# Patient Record
Sex: Male | Born: 1935 | Race: White | Hispanic: No | Marital: Married | Smoking: Former smoker
Health system: Southern US, Community
[De-identification: ages and names within clinical notes are randomized; demographics above are authoritative.]

## PROBLEM LIST (undated history)

## (undated) DIAGNOSIS — C801 Malignant (primary) neoplasm, unspecified: Secondary | ICD-10-CM

## (undated) DIAGNOSIS — R443 Hallucinations, unspecified: Secondary | ICD-10-CM

## (undated) DIAGNOSIS — E785 Hyperlipidemia, unspecified: Secondary | ICD-10-CM

## (undated) DIAGNOSIS — F0390 Unspecified dementia without behavioral disturbance: Secondary | ICD-10-CM

## (undated) DIAGNOSIS — I219 Acute myocardial infarction, unspecified: Secondary | ICD-10-CM

## (undated) DIAGNOSIS — I10 Essential (primary) hypertension: Secondary | ICD-10-CM

## (undated) HISTORY — PX: OTHER SURGICAL HISTORY: SHX169

## (undated) HISTORY — PX: NEPHRECTOMY: SHX65

## (undated) HISTORY — PX: CHOLECYSTECTOMY: SHX55

## (undated) HISTORY — DX: Hallucinations, unspecified: R44.3

## (undated) HISTORY — DX: Acute myocardial infarction, unspecified: I21.9

## (undated) HISTORY — DX: Essential (primary) hypertension: I10

## (undated) HISTORY — DX: Hyperlipidemia, unspecified: E78.5

---

## 1997-12-22 ENCOUNTER — Ambulatory Visit (HOSPITAL_COMMUNITY): Admission: RE | Admit: 1997-12-22 | Discharge: 1997-12-22 | Payer: Self-pay | Admitting: Gastroenterology

## 1998-11-07 ENCOUNTER — Inpatient Hospital Stay (HOSPITAL_COMMUNITY): Admission: EM | Admit: 1998-11-07 | Discharge: 1998-11-09 | Payer: Self-pay | Admitting: Emergency Medicine

## 1998-11-07 ENCOUNTER — Encounter: Payer: Self-pay | Admitting: *Deleted

## 1999-02-11 ENCOUNTER — Emergency Department (HOSPITAL_COMMUNITY): Admission: EM | Admit: 1999-02-11 | Discharge: 1999-02-11 | Payer: Self-pay | Admitting: Emergency Medicine

## 1999-02-11 ENCOUNTER — Encounter: Payer: Self-pay | Admitting: *Deleted

## 2000-04-27 ENCOUNTER — Emergency Department (HOSPITAL_COMMUNITY): Admission: EM | Admit: 2000-04-27 | Discharge: 2000-04-27 | Payer: Self-pay | Admitting: *Deleted

## 2000-05-31 ENCOUNTER — Other Ambulatory Visit: Admission: RE | Admit: 2000-05-31 | Discharge: 2000-05-31 | Payer: Self-pay | Admitting: Urology

## 2000-05-31 ENCOUNTER — Encounter (INDEPENDENT_AMBULATORY_CARE_PROVIDER_SITE_OTHER): Payer: Self-pay

## 2000-09-13 ENCOUNTER — Other Ambulatory Visit: Admission: RE | Admit: 2000-09-13 | Discharge: 2000-09-13 | Payer: Self-pay | Admitting: Dermatology

## 2000-12-15 ENCOUNTER — Encounter: Payer: Self-pay | Admitting: *Deleted

## 2000-12-16 ENCOUNTER — Inpatient Hospital Stay (HOSPITAL_COMMUNITY): Admission: EM | Admit: 2000-12-16 | Discharge: 2000-12-18 | Payer: Self-pay | Admitting: Emergency Medicine

## 2001-04-12 ENCOUNTER — Inpatient Hospital Stay (HOSPITAL_COMMUNITY): Admission: EM | Admit: 2001-04-12 | Discharge: 2001-04-13 | Payer: Self-pay | Admitting: Emergency Medicine

## 2001-04-12 ENCOUNTER — Encounter: Payer: Self-pay | Admitting: Emergency Medicine

## 2001-05-30 ENCOUNTER — Other Ambulatory Visit: Admission: RE | Admit: 2001-05-30 | Discharge: 2001-05-30 | Payer: Self-pay | Admitting: Dermatology

## 2002-02-17 ENCOUNTER — Other Ambulatory Visit: Admission: RE | Admit: 2002-02-17 | Discharge: 2002-02-17 | Payer: Self-pay | Admitting: Dermatology

## 2002-11-12 ENCOUNTER — Ambulatory Visit (HOSPITAL_COMMUNITY): Admission: RE | Admit: 2002-11-12 | Discharge: 2002-11-12 | Payer: Self-pay | Admitting: Orthopedic Surgery

## 2002-12-22 ENCOUNTER — Inpatient Hospital Stay (HOSPITAL_COMMUNITY): Admission: RE | Admit: 2002-12-22 | Discharge: 2002-12-25 | Payer: Self-pay | Admitting: Urology

## 2002-12-22 ENCOUNTER — Encounter (INDEPENDENT_AMBULATORY_CARE_PROVIDER_SITE_OTHER): Payer: Self-pay

## 2004-01-26 ENCOUNTER — Ambulatory Visit: Payer: Self-pay | Admitting: Family Medicine

## 2004-03-15 ENCOUNTER — Ambulatory Visit: Payer: Self-pay | Admitting: Family Medicine

## 2004-04-20 ENCOUNTER — Ambulatory Visit (HOSPITAL_COMMUNITY): Admission: RE | Admit: 2004-04-20 | Discharge: 2004-04-20 | Payer: Self-pay | Admitting: Urology

## 2004-12-21 ENCOUNTER — Ambulatory Visit: Payer: Self-pay | Admitting: Family Medicine

## 2005-11-18 ENCOUNTER — Emergency Department (HOSPITAL_COMMUNITY): Admission: EM | Admit: 2005-11-18 | Discharge: 2005-11-18 | Payer: Self-pay | Admitting: Emergency Medicine

## 2005-11-25 ENCOUNTER — Inpatient Hospital Stay (HOSPITAL_COMMUNITY): Admission: EM | Admit: 2005-11-25 | Discharge: 2005-11-26 | Payer: Self-pay | Admitting: Emergency Medicine

## 2005-12-06 ENCOUNTER — Encounter (INDEPENDENT_AMBULATORY_CARE_PROVIDER_SITE_OTHER): Payer: Self-pay | Admitting: Specialist

## 2005-12-06 ENCOUNTER — Ambulatory Visit (HOSPITAL_BASED_OUTPATIENT_CLINIC_OR_DEPARTMENT_OTHER): Admission: RE | Admit: 2005-12-06 | Discharge: 2005-12-06 | Payer: Self-pay | Admitting: Urology

## 2006-05-12 ENCOUNTER — Inpatient Hospital Stay (HOSPITAL_COMMUNITY): Admission: EM | Admit: 2006-05-12 | Discharge: 2006-05-15 | Payer: Self-pay | Admitting: Emergency Medicine

## 2006-09-11 ENCOUNTER — Ambulatory Visit: Payer: Self-pay | Admitting: Internal Medicine

## 2006-09-11 LAB — CONVERTED CEMR LAB
CO2: 32 meq/L (ref 19–32)
Calcium: 9.8 mg/dL (ref 8.4–10.5)
GFR calc Af Amer: 47 mL/min
Glucose, Bld: 94 mg/dL (ref 70–99)
Potassium: 4.7 meq/L (ref 3.5–5.1)
Sodium: 144 meq/L (ref 135–145)

## 2006-09-12 DIAGNOSIS — K219 Gastro-esophageal reflux disease without esophagitis: Secondary | ICD-10-CM | POA: Insufficient documentation

## 2006-09-12 DIAGNOSIS — D0910 Carcinoma in situ of unspecified urinary organ: Secondary | ICD-10-CM

## 2006-09-12 DIAGNOSIS — N135 Crossing vessel and stricture of ureter without hydronephrosis: Secondary | ICD-10-CM | POA: Insufficient documentation

## 2006-09-12 DIAGNOSIS — Z8546 Personal history of malignant neoplasm of prostate: Secondary | ICD-10-CM

## 2006-09-12 DIAGNOSIS — I2510 Atherosclerotic heart disease of native coronary artery without angina pectoris: Secondary | ICD-10-CM | POA: Insufficient documentation

## 2006-09-27 ENCOUNTER — Ambulatory Visit (HOSPITAL_COMMUNITY): Admission: RE | Admit: 2006-09-27 | Discharge: 2006-09-27 | Payer: Self-pay | Admitting: Urology

## 2007-03-05 ENCOUNTER — Encounter: Payer: Self-pay | Admitting: *Deleted

## 2007-03-05 DIAGNOSIS — Z9861 Coronary angioplasty status: Secondary | ICD-10-CM | POA: Insufficient documentation

## 2007-03-05 DIAGNOSIS — I219 Acute myocardial infarction, unspecified: Secondary | ICD-10-CM | POA: Insufficient documentation

## 2007-03-05 DIAGNOSIS — F411 Generalized anxiety disorder: Secondary | ICD-10-CM | POA: Insufficient documentation

## 2007-03-05 DIAGNOSIS — Z9189 Other specified personal risk factors, not elsewhere classified: Secondary | ICD-10-CM | POA: Insufficient documentation

## 2007-03-05 DIAGNOSIS — H269 Unspecified cataract: Secondary | ICD-10-CM

## 2007-03-05 DIAGNOSIS — Z9889 Other specified postprocedural states: Secondary | ICD-10-CM

## 2007-03-05 DIAGNOSIS — J309 Allergic rhinitis, unspecified: Secondary | ICD-10-CM | POA: Insufficient documentation

## 2007-03-06 ENCOUNTER — Ambulatory Visit: Payer: Self-pay | Admitting: Internal Medicine

## 2007-03-06 DIAGNOSIS — I10 Essential (primary) hypertension: Secondary | ICD-10-CM | POA: Insufficient documentation

## 2007-03-06 DIAGNOSIS — R413 Other amnesia: Secondary | ICD-10-CM

## 2007-03-06 LAB — CONVERTED CEMR LAB
ALT: 16 units/L (ref 0–53)
Albumin: 4.2 g/dL (ref 3.5–5.2)
Bilirubin, Direct: 0.1 mg/dL (ref 0.0–0.3)
Calcium: 9.4 mg/dL (ref 8.4–10.5)
Folate: 10.3 ng/mL
GFR calc Af Amer: 42 mL/min
GFR calc non Af Amer: 35 mL/min
Glucose, Bld: 106 mg/dL — ABNORMAL HIGH (ref 70–99)
Sed Rate: 6 mm/hr (ref 0–20)
Triglycerides: 85 mg/dL (ref 0–149)
VLDL: 17 mg/dL (ref 0–40)
Vitamin B-12: 279 pg/mL (ref 211–911)

## 2007-03-07 ENCOUNTER — Encounter: Payer: Self-pay | Admitting: Internal Medicine

## 2007-09-10 ENCOUNTER — Telehealth: Payer: Self-pay | Admitting: Internal Medicine

## 2007-11-19 ENCOUNTER — Ambulatory Visit: Admission: RE | Admit: 2007-11-19 | Discharge: 2007-12-16 | Payer: Self-pay | Admitting: Radiation Oncology

## 2007-11-20 ENCOUNTER — Ambulatory Visit (HOSPITAL_COMMUNITY): Admission: RE | Admit: 2007-11-20 | Discharge: 2007-11-20 | Payer: Self-pay | Admitting: Urology

## 2007-11-21 ENCOUNTER — Encounter: Payer: Self-pay | Admitting: Internal Medicine

## 2007-12-24 ENCOUNTER — Encounter: Payer: Self-pay | Admitting: Internal Medicine

## 2008-01-22 ENCOUNTER — Ambulatory Visit: Admission: RE | Admit: 2008-01-22 | Discharge: 2008-04-14 | Payer: Self-pay | Admitting: Radiation Oncology

## 2008-01-23 ENCOUNTER — Encounter: Payer: Self-pay | Admitting: Internal Medicine

## 2008-03-16 ENCOUNTER — Telehealth: Payer: Self-pay | Admitting: Internal Medicine

## 2008-04-14 ENCOUNTER — Ambulatory Visit: Admission: RE | Admit: 2008-04-14 | Discharge: 2008-05-12 | Payer: Self-pay | Admitting: Radiation Oncology

## 2008-11-30 ENCOUNTER — Ambulatory Visit: Payer: Self-pay | Admitting: Internal Medicine

## 2008-12-01 ENCOUNTER — Inpatient Hospital Stay (HOSPITAL_COMMUNITY): Admission: EM | Admit: 2008-12-01 | Discharge: 2008-12-02 | Payer: Self-pay | Admitting: Emergency Medicine

## 2008-12-02 ENCOUNTER — Encounter: Payer: Self-pay | Admitting: Internal Medicine

## 2008-12-07 ENCOUNTER — Encounter: Payer: Self-pay | Admitting: Internal Medicine

## 2008-12-11 ENCOUNTER — Encounter: Payer: Self-pay | Admitting: Internal Medicine

## 2008-12-13 ENCOUNTER — Encounter: Admission: RE | Admit: 2008-12-13 | Discharge: 2008-12-13 | Payer: Self-pay | Admitting: Neurology

## 2008-12-29 ENCOUNTER — Inpatient Hospital Stay (HOSPITAL_COMMUNITY): Admission: RE | Admit: 2008-12-29 | Discharge: 2008-12-30 | Payer: Self-pay | Admitting: General Surgery

## 2008-12-29 ENCOUNTER — Encounter: Payer: Self-pay | Admitting: General Surgery

## 2008-12-29 ENCOUNTER — Encounter (INDEPENDENT_AMBULATORY_CARE_PROVIDER_SITE_OTHER): Payer: Self-pay | Admitting: Surgery

## 2009-03-18 ENCOUNTER — Encounter: Payer: Self-pay | Admitting: Internal Medicine

## 2009-07-21 ENCOUNTER — Encounter: Payer: Self-pay | Admitting: Internal Medicine

## 2009-08-23 ENCOUNTER — Encounter: Payer: Self-pay | Admitting: Internal Medicine

## 2010-01-29 ENCOUNTER — Encounter: Payer: Self-pay | Admitting: Orthopedic Surgery

## 2010-02-08 NOTE — Consult Note (Signed)
Summary: [facility]  [facility]   Imported By: Lester Pender 08/27/2009 10:16:02  _____________________________________________________________________  External Attachment:    Type:   Image     Comment:   External Document

## 2010-02-08 NOTE — Letter (Signed)
Summary: [facility]  [facility]   Imported By: Sherian Rein 03/29/2009 09:58:30  _____________________________________________________________________  External Attachment:    Type:   Image     Comment:   External Document

## 2010-04-11 LAB — BASIC METABOLIC PANEL
BUN: 13 mg/dL (ref 6–23)
BUN: 15 mg/dL (ref 6–23)
BUN: 17 mg/dL (ref 6–23)
Calcium: 9.2 mg/dL (ref 8.4–10.5)
Chloride: 103 mEq/L (ref 96–112)
Creatinine, Ser: 1.49 mg/dL (ref 0.4–1.5)
GFR calc non Af Amer: 46 mL/min — ABNORMAL LOW (ref >60)
GFR calc non Af Amer: 53 mL/min — ABNORMAL LOW (ref >60)
Glucose, Bld: 130 mg/dL — ABNORMAL HIGH (ref 70–99)
Glucose, Bld: 138 mg/dL — ABNORMAL HIGH (ref 70–99)
Glucose, Bld: 141 mg/dL — ABNORMAL HIGH (ref 70–99)
Potassium: 4.1 mEq/L (ref 3.5–5.1)
Potassium: 5.3 mEq/L — ABNORMAL HIGH (ref 3.5–5.1)
Sodium: 139 mEq/L (ref 135–145)

## 2010-04-11 LAB — CBC
HCT: 37.2 % — ABNORMAL LOW (ref 39.0–52.0)
HCT: 38.9 % — ABNORMAL LOW (ref 39.0–52.0)
MCHC: 33 g/dL (ref 30.0–36.0)
MCHC: 34 g/dL (ref 30.0–36.0)
MCV: 89 fL (ref 78.0–100.0)
MCV: 89.1 fL (ref 78.0–100.0)
Platelets: 250 10*3/uL (ref 150–400)
Platelets: 215 10*3/uL (ref 150–400)
Platelets: 218 10*3/uL (ref 150–400)
Platelets: 221 10*3/uL (ref 150–400)
RDW: 13.6 % (ref 11.5–15.5)
RDW: 13.9 % (ref 11.5–15.5)
RDW: 14.1 % (ref 11.5–15.5)
WBC: 8.2 10*3/uL (ref 4.0–10.5)

## 2010-04-11 LAB — COMPREHENSIVE METABOLIC PANEL
ALT: 20 U/L (ref 0–53)
AST: 20 U/L (ref 0–37)
Albumin: 3.9 g/dL (ref 3.5–5.2)
Calcium: 9.6 mg/dL (ref 8.4–10.5)
GFR calc Af Amer: 53 mL/min — ABNORMAL LOW (ref >60)
Sodium: 141 mEq/L (ref 135–145)
Total Protein: 6.3 g/dL (ref 6.0–8.3)

## 2010-04-11 LAB — URINALYSIS, MICROSCOPIC ONLY
Ketones, ur: NEGATIVE mg/dL
Leukocytes, UA: NEGATIVE
Nitrite: NEGATIVE
Protein, ur: NEGATIVE mg/dL
pH: 5 (ref 5.0–8.0)

## 2010-04-11 LAB — LIPASE, BLOOD
Lipase: 46 U/L (ref 11–59)
Lipase: 23 U/L (ref 11–59)

## 2010-04-13 LAB — POCT I-STAT, CHEM 8
HCT: 42 % (ref 39.0–52.0)
Hemoglobin: 14.3 g/dL (ref 13.0–17.0)
Potassium: 4.7 mEq/L (ref 3.5–5.1)
Sodium: 141 mEq/L (ref 135–145)

## 2010-04-13 LAB — COMPREHENSIVE METABOLIC PANEL
BUN: 21 mg/dL (ref 6–23)
Calcium: 9.3 mg/dL (ref 8.4–10.5)
Glucose, Bld: 183 mg/dL — ABNORMAL HIGH (ref 70–99)
Sodium: 140 mEq/L (ref 135–145)
Total Protein: 6.3 g/dL (ref 6.0–8.3)

## 2010-04-13 LAB — LIPID PANEL
Cholesterol: 171 mg/dL (ref 0–200)
HDL: 64 mg/dL (ref >39)
Total CHOL/HDL Ratio: 2.7 RATIO
Triglycerides: 42 mg/dL (ref <150)

## 2010-04-13 LAB — BASIC METABOLIC PANEL
BUN: 22 mg/dL (ref 6–23)
CO2: 25 mEq/L (ref 19–32)
Calcium: 9 mg/dL (ref 8.4–10.5)
Creatinine, Ser: 1.33 mg/dL (ref 0.4–1.5)
GFR calc non Af Amer: 52 mL/min — ABNORMAL LOW (ref >60)
Glucose, Bld: 128 mg/dL — ABNORMAL HIGH (ref 70–99)
Sodium: 138 mEq/L (ref 135–145)

## 2010-04-13 LAB — URINALYSIS, ROUTINE W REFLEX MICROSCOPIC
Glucose, UA: NEGATIVE mg/dL
Hgb urine dipstick: NEGATIVE
Ketones, ur: NEGATIVE mg/dL
Protein, ur: NEGATIVE mg/dL
pH: 5.5 (ref 5.0–8.0)

## 2010-04-13 LAB — CBC
HCT: 39.2 % (ref 39.0–52.0)
Hemoglobin: 13.3 g/dL (ref 13.0–17.0)
MCHC: 34 g/dL (ref 30.0–36.0)
RDW: 14 % (ref 11.5–15.5)

## 2010-04-13 LAB — DIFFERENTIAL
Lymphocytes Relative: 3 % — ABNORMAL LOW (ref 12–46)
Lymphs Abs: 0.2 10*3/uL — ABNORMAL LOW (ref 0.7–4.0)
Monocytes Relative: 1 % — ABNORMAL LOW (ref 3–12)
Neutro Abs: 8.1 10*3/uL — ABNORMAL HIGH (ref 1.7–7.7)
Neutrophils Relative %: 96 % — ABNORMAL HIGH (ref 43–77)

## 2010-04-13 LAB — POCT CARDIAC MARKERS
CKMB, poc: 2.8 ng/mL (ref 1.0–8.0)
Troponin i, poc: <0.05 ng/mL (ref 0.00–0.09)
Troponin i, poc: <0.05 ng/mL (ref 0.00–0.09)

## 2010-04-13 LAB — PROTIME-INR
INR: 0.98 (ref 0.00–1.49)
Prothrombin Time: 12.9 seconds (ref 11.6–15.2)

## 2010-04-13 LAB — URINE CULTURE: Colony Count: NO GROWTH

## 2010-05-24 NOTE — Assessment & Plan Note (Signed)
[facility]                           PRIMARY CARE OFFICE NOTE   [name], Cameron                      MRN:          [id number]  DATE:09/11/2006                            DOB:          09/09/1928    Cameron [name] is a 77 year old Caucasian gentleman whom I have not seen  since a [facility] visit in August of 2005.  Previously had seen the  patient last in 1997.   The patient presents with a complaint of a 56-month history of  dysequilibrium.  He also reports he has been having some problems with  sinus congestion, sneezing and loss of smell.  He does describe a  fullness in his ears.  He does have a history of allergic rhinitis.   The patient has been followed at [facility] for prostate cancer in the past as  well as being seen locally by Dr. Retta Diones.  By the patient's report,  he is supposedly is to be scheduled for a bone scan as part of his  ongoing followup.   The patient is scheduled for cataract surgery on the left eye later in  the month of September.   PAST MEDICAL HISTORY:   MEDICAL:  1. Usual childhood disease.  2. History of prostate cancer.  Treatment details not available to me.  3. Coronary artery disease with an myocardial infarction in 1984.      Last cardiac catheterization May, 5, 2008 which revealed no      significant obstructive disease, but an aneurysmal left anterior      descending.  4. Renal cell carcinoma.  5. History of ureteral structure.  6. History of severe gastroesophageal reflux disease with respiratory      problems.  7. Allergy with rhinitis.  8. Bilateral cataracts.   SURGICAL HISTORY:  1. Prostate surgery, remote.  2. Laparoscopic nephrectomy on the left, December of 2004, for renal      cell carcinoma.  3. TUR bladder neck for contracture, December 06, 2005.   PHYSICIAN ROSTER:  1. Dr. Jacinto Halim was the patient's last operative cardiologist.  2. Dr. Jenne Campus is his routine cardiologist.  3. Dr. Sherene Sires  for pulmonary.  4. Dr. Retta Diones for urology.   CURRENT MEDICATIONS:  1. Lorazepam 0.5 mg p.r.n.  2. Flomax 0.4 mg daily.  3. Metoprolol ER 25 mg 1/2 tablet daily.  4. Prilosec OTC 20 mg q.a.m.  5. Vytorin 10 one daily.  6. Aspirin 81 mg daily.   FAMILY HISTORY:  Noncontributory.   SOCIAL HISTORY:  The patient lost his wife after a long marriage in  August of 2007.  She succumbed to end-stage chronic obstructive  pulmonary disease.  The patient currently lives alone, but he has 3  daughters who lives nearby, and they do bring him meals on a regular  basis.   REVIEW OF SYSTEMS:  The patient has had no fevers, sweats or chills.  His weight has been stable.  He has seen Dr. Dagoberto Ligas for  ophthalmology, and is scheduled for cataract surgery.  No ENT,  cardiovascular, respiratory, or GI complaints are noted.   PHYSICAL  EXAMINATION:  Temperature was 97.5, blood pressure 120/80,  pulse was 62, weight 173.  GENERAL APPEARANCE:  He is a well-nourished, well-developed gentleman,  looks younger than his stated chronologic age, in no acute distress.  HEENT EXAM:  EACs and TMs did appear normal.  The patient had no  tenderness to percussion over his frontal or maxillary sinuses.  CHEST:  The patient's lungs were clear with no rales, wheezes, or  rhonchi.  CARDIOVASCULAR:  2+ radial pulse.  He had a regular rate and rhythm  without murmurs, rubs, or gallops.  NEURO EXAM:  The patient had decreased hearing in his left ear by  Rinne's test, and also with lateralization of the left on Weber test.   ASSESSMENT AND PLAN:  1. Dysequilibrium, suspect the patient has labyrinthitis.  He is      advised to take meclizine 12.5 to 25 mg q.6 h. p.r.n.  He is      advised to take pseudoephedrine 30 mg t.i.d.  2. The patient's other medical problems are as listed above.  I would      like him to return to see me for a more complete physical exam and      to reestablish for ongoing continuity  care.     Rosalyn Gess Norins, MD  Electronically Signed    MEN/MedQ  DD: 09/12/2006  DT: 09/12/2006  Job #: [id number]   cc:   Cameron [name]

## 2010-05-24 NOTE — Discharge Summary (Signed)
Cameron [name], Cameron [name] NO.:  [id number]   MEDICAL RECORD NO.:  [id number]           PATIENT TYPE:   LOCATION:                                 FACILITY:   PHYSICIAN:  Vonna Kotyk R. Jacinto Halim, MD       DATE OF BIRTH:  1928/08/04   DATE OF ADMISSION:  05/12/2006  DATE OF DISCHARGE:  05/15/2006                               DISCHARGE SUMMARY   DISCHARGE DIAGNOSES:  1. Chest pain, worrisome for unstable angina, catheterization this      admission showing minor coronary disease with good left ventricular      function.  2. Known moderate coronary disease with a 60% to 70% left anterior      descending in the past in 2002.  3. Treated dyslipidemia.  4. History of renal cell cancer, status post nephrectomy   HOSPITAL COURSE:  The patient is a 77 year old male who was admitted by  Dr. Jacinto Halim May 13, 2006 with chest pain worrisome for unstable angina.  He  had retrosternal pressure-like pain, which lasted about 15 minutes.  After admission to the emergency room, he was pain-free.  He does have a  history of moderate coronary disease in 2002 by catheterization.  He was  admitted to telemetry with unstable angina.  A cath done May 14, 2006,  showed no significant disease, good LV function.  He did have some  ectatic arteries and aneurysmal dilatation of the LAD.  Plavix was  added.  We felt he could be discharged May 15, 2006.  We did cut his beta  blocker back at discharge as he was bradycardic.   DISCHARGE MEDICATIONS:  Niaspan 500 mg p.o. h.s., aspirin 81 mg a day,  Toprol XL 25 mg 1/2 tablet a day, Prilosec OTC once a day, Plavix 75 mg  a day, Xanax 0.25 q.6h. p.r.n., Vytorin 10/20 daily, and ophthalmic  drops as taken at home.   EKG shows sinus rhythm, sinus bradycardia without acute changes.  Chest  x-ray shows no acute cardiopulmonary disease.  White count 7.3,  hemoglobin 12.9, hematocrit 38.6, platelets 176, INR 1.1.  Sodium 137,  potassium 4.2, BUN 13, creatinine 1.49.   Liver functions were normal.  CK was 247 with negative MB and troponin, LDL 53, HDL 39.  TSH 2.32.   DISPOSITION:  The patient is discharged in stable condition and will  follow up with Dr. Jenne Campus as an outpatient.      Abelino Derrick, P.A.      Cristy Hilts. Jacinto Halim, MD  Electronically Signed    LKK/MEDQ  D:  02/21/2007  T:  02/23/2007  Job:  [id number]

## 2010-05-27 NOTE — Cardiovascular Report (Signed)
NAMEAMES, HOBAN               ACCOUNT NO.:  [id number]   MEDICAL RECORD NO.:  [id number]          PATIENT TYPE:  INP   LOCATION:  4715                         FACILITY:  [facility]   PHYSICIAN:  Cristy Hilts. Jacinto Halim, MD       DATE OF BIRTH:  [birth year]   DATE OF PROCEDURE:  05/14/2006  DATE OF DISCHARGE:                            CARDIAC CATHETERIZATION   ATTENDING CARDIOLOGIST:  Darlin Priestly, M.D.   REFERRING PHYSICIAN:  Rosalyn Gess. Norins, M.D.   PROCEDURES PERFORMED:  1. Left ventriculography.  2. Selective right and left coronary arteriography.  3. Ascending aortogram.  4. Abdominal aortogram.   INDICATIONS:  Mr. Cameron [name] is a 77 year old fairly active gentleman  with a history of proximal 60-70% stenosis of the LAD by prior cardiac  catheterization in 2002.  He was admitted to the hospital with recurrent  chest pain which was equivalent unstable angina.  She was ruled out for  myocardial infarction.  EKG did not reveal any significant change.  Because of known coronary artery disease, he was now brought to the  cardiac catheterization lab to reevaluate his coronary anatomy.  Please  note that in 2002 he had a flow wire evaluation of this LAD stenosis  which was not felt to be hemodynamically significant at that time.   HEMODYNAMIC DATA:  The left ventricular pressures were 131/4 with end-  diastolic pressure of 20 mmHg.  The aortic pressure 131/60 with a mean  of 90 mmHg.  There was no pressure gradient across the aortic valve.   ANGIOGRAPHIC DATA:  LEFT VENTRICLE:  Left ventricular systolic function  was normal with ejection fraction of 55-60%.  There was no significant  mitral regurgitation.   RIGHT CORONARY ARTERY:  Right coronary artery is a large-caliber vessel  and a dominant vessel.  It is smooth and normal.   LEFT MAIN:  Left main is a large-caliber vessel.  Distally, it has mild  ectasia at the origin of circumflex and LAD.   LEFT ANTERIOR DESCENDING:  LAD  is a large-caliber vessel.  It has an  aneurysmal dilatation in the proximal LAD followed by normal segment in  the proximal to mid segments.  There is a large diagonal that comes off  from the proximal segment (high diagonal one) which has an ostial 30-40%  stenosis, but is otherwise smooth and normal.  The rest of the LAD has  very minimal luminal irregularity.   RAMUS INTERMEDIATE:  Ramus intermediate is a small to moderate-caliber  vessel which is smooth and normal.   CIRCUMFLEX:  Circumflex is a large-caliber vessel.  It has mild ectasia  in the proximal segment and nicely tapers off in the mid segment.  There  is no significant luminal obstruction in the circumflex.   IMPRESSIONS:  1. Aneurysmal dilatation of the proximal left anterior descending      (LAD) which appears to be slightly progressed compared to the prior      cardiac catheterization in 2002 with a 40% diagonal ostial      stenosis.  The rest of the LAD has very minimal  luminal      irregularity.  2. Very minimal ectasia of the proximal circumflex with smooth      tapering into the body of the circumflex without any significant      luminal obstruction.  3. Right coronary artery is normal.  4. No evidence of dissection, thrombus into the left main or left      anterior descending artery.   RECOMMENDATIONS:  1. The patient will be continued on risk modification.  Plavix will be      added to his present regimen with aspirin.  2. Will also add Niaspan 500 mg p.o. at night and see if we can      titrate it up to 1000 mg p.o. at night to improve his HDL profile.  3. Abdominal aortogram revealed one renal artery on the right which is      widely patent.  There was no evidence abdominal aortic aneurysm.  4. The ascending aortogram revealed no evidence ascending aneurysm.      The abdominal aortogram and ascending aortogram were performed to      evaluate for aneurysmal dilatation given the aneurysmal dilatation       of the LAD.  5. Single renal artery which is widely patent.  Left renal artery has      known left nephrectomy in the past.  The patient will be continued      with hydration and probably be discharged home in the morning.   COMPLICATIONS:  There was a very small dissection noted at the insertion  of the sheath into the right femoral artery which did not appear to be  hemodynamically significant on pullback.  Manual pressure was held.  He  will follow up with Dr. Jenne Campus in 2-3 weeks.   A total of 120 mL of contrast was utilized for diagnostic angiography.   TECHNIQUE OF THE PROCEDURE:  Under usual sterile precautions, using a 6-  French right femoral arterial access, a multipurpose B2 catheter was  advanced into the ascending aorta and then into the left ventricle.  Left ventriculography was performed both in LAO and RAO projection.  Catheter was then pulled back into the ascending aorta and right  coronary artery selectively engaged and angiography was performed.  Because of a large aortic root, a 6-French Judkins Left-5 diagnostic  catheter was utilized to engage left main coronary artery and  angiography was performed.  Then, this catheter was pulled out in the  usual fashion over the J-wire and the multipurpose B2 catheter was re-  advanced back into the ascending artery and the ascending aortogram was  performed in the LAO projection and abdominal aortogram in the AP  projection.  Right femoral angiography was performed through the  arterial access sheath.  The patient tolerated the procedure.  Except  for some minor sheath hematoma, no significant complications were noted.      Cristy Hilts. Jacinto Halim, MD  Electronically Signed     JRG/MEDQ  D:  05/14/2006  T:  05/14/2006  Job:  [id number]   cc:   Rosalyn Gess. Norins, MD  Darlin Priestly, MD

## 2010-05-27 NOTE — Discharge Summary (Signed)
Cameron [name], Cameron [name]               ACCOUNT NO.:  [id number]   MEDICAL RECORD NO.:  [id number]          PATIENT TYPE:  INP   LOCATION:  4715                         FACILITY:  [facility]   PHYSICIAN:  Cameron [name], P.A.   DATE OF BIRTH:  1928-01-20   DATE OF ADMISSION:  05/12/2006  DATE OF DISCHARGE:  05/15/2006                               DISCHARGE SUMMARY   ADDENDUM   DISCHARGE MEDICATIONS:  [name] is also taking eye drops at home,  Xanax p.r.n. and Vytorin 10/20 and he will continue those.      Cameron [name], P.ALenard Lance  D:  05/15/2006  T:  05/15/2006  Job:  [id number]

## 2010-05-27 NOTE — Discharge Summary (Signed)
[name], Cameron               ACCOUNT NO.:  [id number]   MEDICAL RECORD NO.:  [id number]          PATIENT TYPE:  INP   LOCATION:  4715                         FACILITY:  [facility]   PHYSICIAN:  Abelino Derrick, P.A.   DATE OF BIRTH:  1928-08-11   DATE OF ADMISSION:  05/12/2006  DATE OF DISCHARGE:  05/15/2006                               DISCHARGE SUMMARY   NO DICTATION FOR THIS JOB.      Abelino Derrick, P.ALenard Lance  D:  05/15/2006  T:  05/15/2006  Job:  [id number]

## 2010-05-27 NOTE — H&P (Signed)
Cameron [name], MCKIBBEN                         ACCOUNT NO.:  [id number]   MEDICAL RECORD NO.:  [id number]                   PATIENT TYPE:  INP   LOCATION:  0012                                 FACILITY:  [facility]   PHYSICIAN:  Susanne Borders, MD                     DATE OF BIRTH:  1928/03/09   DATE OF ADMISSION:  12/22/2002  DATE OF DISCHARGE:                                HISTORY & PHYSICAL   CHIEF COMPLAINT:  Left kidney mass.   HISTORY OF PRESENT ILLNESS:  Cameron [name] is a 77 year old white male who has  been a patient of Dr. Valda Lamb in the past.  He has had a transurethral  resection of prostate gland in the remote past.  The patient recently  underwent a chest x-ray as  part of a preoperative workup for a rotator cuff  injury.  On that chest x-ray, he had multiple nodules within his lung which  were all too small to characterize.  Because of the multiple nodules,  however, a CT scan of the chest was obtained which demonstrated a mass in  the patient's left upper pole of his kidney.  The formal CT scan with  contrast was obtained of the abdomen and pelvis which demonstrated a 4.3 cm  x 4.7 cm solid enhancing mass.  This mass was consistent with a renal cell  carcinoma.  Dr. Aldean Ast presented the patient with his options which  included an open radical nephrectomy as well as a laparoscopic nephrectomy.  The patient wished to undergo a laparoscopic nephrectomy, and was therefore  referred to Dr. Retta Diones.  The patient is to have a left laparoscopic  nephrectomy today, will be admitted postoperatively for his convalescence.   PAST MEDICAL HISTORY:  1. Status post myocardial infarction 20 years ago.  2. Hypercholesterolemia.  3. History of prostate cancer 18 years ago.   MEDICATIONS:  1. Toprol 25 mg p.o. q.a.m.  2. Flomax 0.4 mg p.o. q. day.  3. Prilosec 20 mg p.o. q. day.  4. Aspirin 325 mg p.o. q. day.  5. Mavik 15 mg p.o. q.h.s.   ALLERGIES:  PENICILLIN which causes a  rash.   FAMILY HISTORY:  No known genitourinary disease.  He had a father who died  of cardiac failure and pulmonary embolus.   SOCIAL HISTORY:  The patient is a retired Visual merchandiser.  He has a heavy smoking  history, but quit 37 years ago and is not a drinker.   REVIEW OF SYSTEMS:  See history of present illness, otherwise negative for  all systems.   PHYSICAL EXAMINATION:  GENERAL:  Alert and oriented x3, in no acute  distress.  VITAL SIGNS:  Temperature 97.8, pulse 69, respirations 16, blood pressure  140/86.  HEENT:  Head is normocephalic, atraumatic.  Extraocular movements are  intact.  Oropharynx is clear without erythema or exudate.  NECK:  Supple without masses or  adenopathy.  There is no supraclavicular,  axillary, or inguinal adenopathy.  LUNGS:  Clear to auscultation bilaterally.  HEART:  Regular rate and rhythm without murmurs, rubs, or gallops.  ABDOMEN:  Soft, nontender, nondistended, there are no palpable masses, there  is no costovertebral angle tenderness.  EXTREMITIES:  Without clubbing or edema.  NEUROLOGIC:  No focal motor sensory deficits are appreciated.   IMPRESSION:  A 77 year old with a solid enhancing left renal mass.  The  patient will undergo left laparoscopic nephrectomy today and be admitted  postoperatively for his convalescence.  He will be admitted to the step-down  unit because of his history of cardiac event and other medical morbidities.                                               Susanne Borders, MD    DR/MEDQ  D:  12/22/2002  T:  12/22/2002  Job:  [id number]

## 2010-05-27 NOTE — Discharge Summary (Signed)
NAMEMAVERIK, Cameron [name]                         ACCOUNT NO.:  [id number]   MEDICAL RECORD NO.:  [id number]                   PATIENT TYPE:  INP   LOCATION:  0162                                 FACILITY:  [facility]   PHYSICIAN:  Bertram Millard. Dahlstedt, M.D.          DATE OF BIRTH:  06/28/1928   DATE OF ADMISSION:  12/22/2002  DATE OF DISCHARGE:  12/25/2002                                 DISCHARGE SUMMARY   PRIMARY DIAGNOSIS:  Left renal cell carcinoma.   OTHER DIAGNOSES:  1. Coronary artery disease status post myocardial infarction 20 years ago.  2. Hypercholesterolemia.  3. History of prostate cancer, stable.   BRIEF HISTORY:  A 77 year old male who was admitted at this time for  laparoscopic removal of his left kidney.  His urologic history dates back 20  years when he underwent a TURP and was found to have incidentally discovered  prostate cancer.  That has been followed carefully since that time.  There  is no evidence of recurrence.  He was found to have some small nodules on  his lung on preoperative x-ray for a rotator cuff repair.  Further  evaluation also revealed a left renal mass.  The nodules will be followed by  Dr. Edwyna Shell down the road.  These are quite small and did not need treatment  at the present time.  He is admitted for removal of his left kidney as it  has a 4.7 cm solid enhancing mass.  He has been cleared from cardiac  standpoint.   HOSPITAL COURSE:  The patient was admitted to the operating room and  underwent a left hand-assisted laparoscopic nephrectomy.  He tolerated the  procedure well.  His postoperative course was unremarkable.  He remained  afebrile and was advanced to a regular diet without difficulty.  He was  found to have wounds that were healing well without evidence of infection.  After his catheter was removed he voided well.   He was discharged on postoperative day #3.  At that time he was in improved  condition, on a regular diet, and was on his  regular medications including  oral pain medicines.   Pathology of the specimen revealed left renal cell carcinoma, Furman nuclear  grade 2/4 with pathologic stage T1.   The patient was discharged on his pre hospital medications plus Vicodin and  Colace for a stool softener.  He will follow up with me in approximately one  week.  He was given activity restrictions.                                               Bertram Millard. Dahlstedt, M.D.    SMD/MEDQ  D:  01/01/2003  T:  01/01/2003  Job:  [id number]   cc:   Darlin Priestly, M.D.  [phone]  [address]  [place]  [place] [address]  Fax: [phone]   Rosalyn Gess. Norins, M.D. [facility]

## 2010-05-27 NOTE — Discharge Summary (Signed)
[place]  Patient:    DECARI, DUGGAR Visit Number: [id number] MRN: [id number]          Service Type: MED Location: [phone] Attending Physician:  Silvestre Mesi Dictated by:   Halford Decamp Delanna Ahmadi, R.N., N.P. Admit Date:  12/15/2000 Discharge Date: 12/18/2000   CC:         Rosalyn Gess. Norins, M.D. [facility]   Discharge Summary  HISTORY OF PRESENT ILLNESS:  The patient is a 77 year old white married male patient of Dr. Lenise Herald.  He has a prior medical history of coronary artery disease with a percutaneous transluminal coronary angioplasty approximately two years previous to this admission.  The patient had a myocardial infarction at age 72.  He came into the hospital with chest pain symptoms.  HOSPITAL COURSE:  The patient was put on intravenous heparin and Plavix.  His chest pain was relieved with sublingual nitroglycerin and it was decided the patient needed to undergo catheterization.  LABORATORY STUDIES:  His CK-MBs and troponin levels came back negative.  He underwent catheterization on 12/17/00 by Dr. Lenise Herald. He only had 50 to 60% LAD.  His LV was normal with ejection fraction of 50%. His cholesterol came back with a total cholesterol of 167, triglycerides of 76, HDL 47 and LDL 105.  The patients Mevacor apparently had recently been decreased.  On 12/18/00 his right groin was without bruise or hematoma.  He was seen by Dr. Nanetta Batty and was stable to be discharged home.  DISCHARGE LABORATORY DATA:  His hemoglobin on the day of discharge was 14, hematocrit was 41. His BUN was 9, creatinine 1.1.  His sodium was 141. The potassium was 4.0.  DISCHARGE PHYSICAL EXAMINATION:  VITAL SIGNS: Blood pressure 104/78, heart rate around 58.  DISCHARGE MEDICATIONS: 1. Toprol XL 25 mg once per day. 2. Prilosec 20 mg twice per day.  We increased this for one month and then    he will go back to once a day dosing. 3. Enteric  coated aspirin 325 mg once per day. 4. Mevacor 40 mg.  We will increase this back to 1.5 tablets q.d.  INSTRUCTIONS: 1. Patient is to do no strenuous activity, no lifting, driving and no sexual    activity for three days. 2. Patient should be on a low saturated fat diet. 3. Patient will have follow up with Dr. Jenne Campus on 01/01/01 at 9:30 in the    [place] office.  DISCHARGE DIAGNOSES: 1. Chest pain nonischemic related. 2. History of coronary artery disease. 3. Status post catheterization with 56% left anterior descending. 4. Dyslipidemia. 5. Hypertension, controlled. 6. Sinus bradycardia. 7. History of gastroesophageal reflux disease. Dictated by:   Halford Decamp Delanna Ahmadi, R.N., N.P. Attending Physician:  Silvestre Mesi DD:  12/18/00 TD:  12/18/00 Job: [id number]

## 2010-05-27 NOTE — Discharge Summary (Signed)
[place]  Patient:    Cameron [name], Cameron [name] Visit Number: [id number] MRN: [id number]          Service Type: MED Location: [phone] Attending Physician:  Darlin Priestly Dictated by:   Darcella Gasman Ingold, F.N.P.C. Admit Date:  04/12/2001 Discharge Date: 04/13/2001   CC:         Juluis Mire, M.D.   Discharge Summary  DISCHARGE DIAGNOSES: 1. Chest pain, negative myocardial infarction, nonspecific. 2. History of coronary disease with last catheterization noncritical    disease. 3. Hyperlipidemia.  DISCHARGE MEDICATIONS: 1. Aspirin 325 daily. 2. Toprol XL 50 mg 1/2 daily. 3. Mevacor 40 mg 1.5 pills daily. 4. Prilosec 20 mg daily. 5. Nitroglycerin p.r.n.  DISCHARGE INSTRUCTIONS: ACTIVITY:  As tolerated without exertion.  DIET:  Low-fat, low-cholesterol diet.  FOLLOW-UP:  Office will contact patient with appointment on day of outpatient stress test.  HISTORY OF PRESENT ILLNESS:  [age]-year-old white married male with history of coronary disease including an angioplasty to the LAD in 2000, with his last heart cath December 2002, which revealed no significant disease, patent angioplasty site and EF of 50%.  He did have 50-60% disease in the LAD proximal portion.  That pain was felt to be noncardiac in December when he had the heart cath.  On April 12, 2001 after mowing grass all day, around 6 PM in the evening, he had soreness in his chest.  He took a pill _____ nitroglycerin without relief.  No associated symptoms of nausea, vomiting, diaphoresis or shortness of breath; came to the emergency room.   When he was seen he had no chest pain.  PAST MEDICAL HISTORY:  Cardiac as described.  Hyperlipidemia, hypertension and prostate cancer.  SOCIAL HISTORY, FAMILY HISTORY AND REVIEW OF SYSTEMS:  See H&P.  ALLERGIES:  PENICILLIN.  OUTPATIENT MEDICATIONS: 1. Aspirin daily. 2. Prilosec 20 daily. 3. Toprol XL 25 daily. 4. Mevacor 60  mg daily.  PHYSICAL EXAMINATION AT DISCHARGE:  VITAL SIGNS:  Blood pressure 108/68, pulse 60, respirations 20, temp 96.9 and oxygen saturation 94%.  GENERAL APPEARANCE:  Alert and oriented white male.  SKIN:  Warm and dry.  LUNGS:  Clear without rales, rhonchi or wheezes.  HEART:  Sounds S1 and S2.  Regular rate and rhythm without murmur, gallop, rub or click.  ABDOMEN:  Soft and nontender.  Positive bowel sounds.  EXTREMITIES:  No edema of his extremities.  NEUROLOGIC:  Nonfocal.  LABORATORY DATA:  Hemoglobin 13.6, hematocrit 39.7, WBC 8.1, MCV 84, platelets 224,000, neutrophils 77, lymphs 12, monos 7, eos 4, and basos 0.  Sodium 140, potassium 4.0, chloride 105, glucose 132, BUN 24, and creatinine 1.3.  CK 194, MB 3.4, to 147, MB 2.5.  Troponin I 0.01.  Cholesterol 163, triglycerides 175, HDL 49, and LDL 69.  EKG; sinus rhythm to sinus bradycardia, heart rate 55, inferior infarction age undetermined.  First EKG sinus rhythm; normal EKG.  HOSPITAL COURSE:  Mr. Wainwright came in April 12, 2001, was admitted for over night observation for chest pain.  Cardiac enzymes were negative.  EKG was without acute changes.  IV nitrate was Dcd the morning of April 13, 2001. Patient ambulated without problems and was discharged home.  He will follow up with outpatient Cardiolite.  Discharged by Dr. Jacinto Halim. Dictated by:   Darcella Gasman Ingold, F.N.P.C. Attending Physician:  Darlin Priestly DD:  05/03/01 TD:  05/04/01 Job: [id number]

## 2010-05-27 NOTE — H&P (Signed)
Cameron [name], Cameron [name]                         ACCOUNT NO.:  [id number]   MEDICAL RECORD NO.:  [id number]                   PATIENT TYPE:  INP   LOCATION:  0012                                 FACILITY:  [facility]   PHYSICIAN:  Bertram Millard. Dahlstedt, M.D.          DATE OF BIRTH:  1928/10/17   DATE OF ADMISSION:  12/22/2002  DATE OF DISCHARGE:                                HISTORY & PHYSICAL   REASON FOR ADMISSION:  Left renal mass.   BRIEF HISTORY:  This 77 year old gentleman is admitted to the hospital at  this time for surgical management of a left upper pole mass.  The patient  had a chest x-ray and CT done prior to a scheduled shoulder operation.  The  chest CT revealed a left upper pole mass.  Further characterization was  performed using an abdominal CT which revealed a 4.3 x 5.7 cm area in his  left upper pole suspicious for a renal cell carcinoma.  The patient has some  small lung nodules that was evaluated, and will be followed by Dr. Dewayne Shorter.  At this point, they will just be followed with CT scan and eventual  VATS procedure if there is enlargement of his lung nodules.   Dr. Aldean Ast is the urologist who has been following Mr. Greeno for many  years.  He has stage A1 adenocarcinoma of the prostate without evidence of  progression over more than 10 years of following him.   MEDICAL HISTORY:  Significant for coronary artery disease.  He has been  cleared by Dr. Jenne Campus.  He had an MI 20 years ago.  The patient also has  hypercholesterolemia.   MEDICATIONS:  1. Toprol-XL 50 mg one-half p.o. q.a.m.  2. Vicodin p.r.n. shoulder pain.  3. Flomax 0.4 mg p.o. daily.  4. Prilosec 20 mg daily.  5. An aspirin a day, which was stopped a few weeks ago.  6. Mobic at night.   SOCIAL HISTORY:  The patient quit cigarette smoking 35 years ago.  He is  widowed - his wife had emphysema.  He has three daughters.  He is retired.   REVIEW OF SYSTEMS:  Essentially noncontributory.  He  has no active chest  pain at the current time.  He has no PND or orthopnea.  He has no peripheral  edema.  He voids fairly well, and has had no gross hematuria.  He has no  flank pain.   PHYSICAL EXAMINATION:  GENERAL:  A thin, healthy-appearing elderly male.  VITAL SIGNS:  Normal.  HEENT:  Normal.  NECK:  Supple without thyromegaly or adenopathy.  CHEST:  Clear breath sounds throughout both lung fields.  HEART:  Regular rate and rhythm.  No rubs, gallops, or murmurs.  ABDOMEN:  Scaphoid, soft, nondistended, nontender.  No masses, no megaly.  Bowel sounds present.  EXTERNAL GENITALIA:  Normal.  RECTAL:  Per Dr. Aldean Ast.  PERIPHERAL EXAM:  No edema.  No cyanosis or clubbing.  Distal pulses were  normal.   LABORATORY DATA:  CBC on admission was normal.  BMET and a recent CMET were  all normal, with a normal alkaline phosphatase.  EKG revealed evidence of an  old inferior MI with sinus bradycardia.  Otherwise unremarkable.   IMPRESSION:  1. Left renal mass.  2. Coronary artery disease, stable.  3. History of prostate cancer, stable.   PLAN:  Laparoscopic hand-assisted left nephrectomy.                                               Bertram Millard. Dahlstedt, M.D.    SMD/MEDQ  D:  12/22/2002  T:  12/22/2002  Job:  [id number]   cc:   Darlin Priestly, M.D.  [phone] [address]  [place]  [place] [address]  Fax: [phone]   Rozanna Boer., M.D.  [address]  [place]  [place] [address]  Fax: [phone]   Rosalyn Gess. Norins, M.D. [facility]

## 2010-05-27 NOTE — H&P (Signed)
Cameron [name], Cameron [name]               ACCOUNT NO.:  [id number]   MEDICAL RECORD NO.:  [id number]          PATIENT TYPE:  INP   LOCATION:  1404                         FACILITY:  [facility]   PHYSICIAN:  Bertram Millard. Dahlstedt, M.D.DATE OF BIRTH:  Feb 15, 1928   DATE OF ADMISSION:  11/25/2005  DATE OF DISCHARGE:                              HISTORY & PHYSICAL   REASON FOR ADMISSION:  Fever, chills, burning with urination.   BRIEF HISTORY:  A 77 year old male who is a patient of our practice.  His daughter called earlier today with him having fever 50 101.5,  dysuria, frequency, urgency.  He also has back pain.  The back pain has  been chronic.   He is status post a left nephrectomy 3 years ago for renal cell  carcinoma.  He has had negative followup since that time by Dr.  Aldean Ast.  He had a TURP years ago.  He had incidentally discovered  prostate cancer and has been followed with a stable PSA.   The patient presented to the emergency room here at [facility] last  weekend with gross hematuria.  He was subsequently followed by Dr.  Aldean Ast on Monday.  He underwent cystoscopy.  At that time, urinalysis  was basically clear.  He had a very tight urethral stricture which was  impassable with the scope.  Dilation was to be performed later.  The  patient was placed on a 10-day course of Cipro.   For the past 2 days, he has developed some fevers, feeling bad, chills,  shakes, dysuria, frequency and nocturia q. 1 h.  His daughters called  morning with the subsequent to history, and I recommended that he come  to the emergency room for further evaluation.   PAST MEDICAL HISTORY:  Significant for:  1. Coronary artery disease.  He is followed by Dr. Jenne Campus.  He has      had a PTCA.  2. He has left nephrectomy in December 2004.  3. He had TURP in the remote past.  4. He has anxiety.  5. He has hypercholesterolemia.   MEDICATIONS:  1. Metoprolol 25 mg p.o. q.a.m.  2. Xanax 0.5 mg p.o. q.6 h  p.r.n. anxiety.  3. Vitorin10/20 one p.o. q.a.m.  4. Cipro.  5. He stopped baby aspirin a few days ago.   ALLERGIES:  No known drug allergies.   SOCIAL HISTORY:  He is widowed.  He has three children.  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  He denies current  tobacco use.   FAMILY HISTORY:  Is noncontributory.   REVIEW OF SYSTEMS:  Is significant for recent fever and chills.  He has  had intermittent gross hematuria.  He has previously-mentioned voiding  symptoms.  He has no anterior abdominal pain.  No nausea, no vomiting.  Has been able to take food and fluids.  No change in bowel habits.  No  unplanned weight gain or weight loss.   PHYSICAL EXAMINATION:  GENERAL:  A pleasant elderly male.  VITAL SIGNS: He was febrile.  Vital signs as recorded in chart.  NECK:  Supple without thyromegaly or adenopathy.  CHEST: Clear breath sounds bilaterally.  HEART: Normal rate and rhythm.  ABDOMEN: Flat, soft, nondistended, nontender.  Bladder was nonpalpable.  Slight suprapubic tenderness.  No rebound or guarding, no  hepatosplenomegaly, no masses.  GU: Phallus normal.  No lesions, no fibrotic areas or plaques.  Glans  normal, meatus normal location and size.  No blood or discharge.  Scrotal skin, testicles, cords, epididymal structures normal.  RECTAL: Normal anal sphincter tone.  Gland 3+ with significant  asymmetry, right lobe larger than left.  Quite tender.  No rectal  masses.  SKIN: No peripheral edema.  EXTREMITIES: DP and PT pulses were normal in both feet.   RELEVANT STUDIES:  Revealed a white count of 20,000 (It was normal week  ago).  CMET pending.   Urinalysis is pending.   Chest x-ray pending.   I tried placing a 14-French coude tip catheter.  This would not go.  I  then cytoscopically placed a guidewire.  This went through a very, very  tight urethral stricture.  I was able, eventually, to dilate him to a 39-  [place] with Haymann dilators.  It  was difficult to eventually place a 14-  French Councill tip catheter.  I had tried placing both a 14-French  coude and a 16-French Councill before this.  With significant  difficulty, a 14-French was placed.  The balloon was filled with 5 mL of  saline and hooked to dependent drainage.   IMPRESSION:  1. Probable prostatitis versus pyelonephritis.  2. Left renal cell carcinoma status post left nephrectomy 3 years ago      without evidence of recurrence.  He does have stable pulmonary      nodules which were thought to benign.  3. Tight urethral stricture status post dilatation  4. Prostate cancer followed with active surveillance   PLAN:  1. I will continue antibiotics but change to Rocephin 500 mg IV q.d.  2. He was given 1 mg of Dilaudid IM in the emergency room.  3. Follow up with labs.  4. Paired blood cultures.  5. Chest x-ray.  6. Will leave catheter in for now.      Bertram Millard. Dahlstedt, M.D.  Electronically Signed     SMD/MEDQ  D:  11/25/2005  T:  11/25/2005  Job:  [id number]

## 2010-05-27 NOTE — Op Note (Signed)
Cameron [name], Cameron [name]               ACCOUNT NO.:  [id number]   MEDICAL RECORD NO.:  [id number]          PATIENT TYPE:  AMB   LOCATION:  NESC                         FACILITY:  [facility]   PHYSICIAN:  Courtney Paris, M.D.DATE OF BIRTH:  15-Jul-1928   DATE OF PROCEDURE:  12/06/2005  DATE OF DISCHARGE:                               OPERATIVE REPORT   PREOPERATIVE DIAGNOSIS:  Bladder neck contracture with history of  retention, previous cancer of the prostate.   POSTOPERATIVE DIAGNOSIS:  Bladder neck contracture with history of  retention, previous cancer of the prostate.   OPERATION:  TUR bladder neck contracture.   ANESTHESIA:  General.   SURGEON:  Courtney Paris, M.D.   BRIEF HISTORY:  This 77 year old patient is admitted with the bladder  neck contracture that was dilated in the emergency room because of  retention about two weeks ago.  He enters now to have this opened  surgically.  A 14 Foley had been inserted by Dr. Retta Diones.  He had a  previous TURP March 87, had well differentiated adenocarcinoma of  prostate at that time but repeat TURP 2 months later showed no cancer.  Neither did a biopsy, transrectal ultrasound and biopsy of prostate 1989  and 2002 also negative for cancer.  He had a previous left nephrectomy  for cancer of the kidney in the spring of 2005 and does have some  microscopic hematuria.   The patient was placed on the operating table in dorsal lithotomy  position.  After satisfactory induction of general anesthesia, the 14  Foley catheter was removed.  Using the resectoscope, I was able to pass  this down into the posterior urethra and then with the camera and just  the blade I was able to make some pictures of the preoperative bladder  neck contracture.  I then sized at 11 and 2 o'clock to be able to get  into the bladder.  I hen replaced the Collins knife with the  resectoscope loop and then was able to resect the bladder neck back to  circular  bladder neck fibers.  Using the chips were evacuated from the  bladder and the hemostasis was carefully achieved.  Post procedure  pictures were also made.  When the hemostasis was adequate, the scope  was removed and a #22 Foley catheter was inserted without difficulty and  left to straight drainage.  The irrigant was clear, the patient was  taken to recovery room in good condition.   The plan will be to leave the catheter for a week postoperatively.  He  will take it out at that time eight hours before I see him in the office  for a postvoid residual.  He tolerated the procedure well.      Courtney Paris, M.D.  Electronically Signed    HMK/MEDQ  D:  12/06/2005  T:  12/06/2005  Job:  [id number]

## 2010-05-27 NOTE — Op Note (Signed)
Cameron [name], Cameron [name]                         ACCOUNT NO.:  [id number]   MEDICAL RECORD NO.:  [id number]                   PATIENT TYPE:  INP   LOCATION:  0012                                 FACILITY:  [facility]   PHYSICIAN:  Bertram Millard. Dahlstedt, M.D.          DATE OF BIRTH:  07/27/1928   DATE OF PROCEDURE:  12/22/2002  DATE OF DISCHARGE:                                 OPERATIVE REPORT   PREOPERATIVE DIAGNOSES:  Left upper pole renal mass.   POSTOPERATIVE DIAGNOSES:  Left upper pole renal mass.   OPERATION/PROCEDURE:  Left hand-assisted laparoscopic radical nephrectomy.   SURGEON:  Bertram Millard. Retta Diones, M.D.   ASSISTANT:  Susanne Borders, M.D.   ANESTHESIA:  General endotracheal anesthesia.   ESTIMATED BLOOD LOSS:  150 mL.   SPECIMENS:  Left kidney including pararenal fat and Gerota's fascia for  pathologic analysis.   COMPLICATIONS:  None.   DRAINS:  14-French coude catheter to straight drainage.   DISPOSITION:  To the post anesthesia care unit in stable condition.   INDICATIONS FOR PROCEDURE:  [name] is a 77 year old male who was  recently incidentally found to have a left upper pole mass which was solid  and enhancing and consistent with renal cell carcinoma.  He is a patient of  Dr. Valda Lamb who referred him to Dr. Retta Diones for laparoscopic  nephrectomy.  The patient consented to this after understanding the risks,  benefits and alternatives.   DESCRIPTION OF PROCEDURE:  The patient was brought to the operating room and  correctly identified by his identification bracelet.  He was given  preoperative antibiotics and general endotracheal anesthesia.  He was  shaved, prepped and draped in the usual sterile fashion.  A bean bag was  used to place the patient with his left flank up.   An incision for the hand part was made about the patient's umbilicus.  This  incision was made with a scalpel and extended approximately above and below  the umbilicus.  Camper's and  Scarpa's fascia were incised with Bovie  electrocautery down to the rectus sheath.  The rectus sheath was then  incised with the Bovie electrocautery revealing the midline.  Blunt  dissection was used to visualize the peritoneum.  The peritoneum was grasped  with pickups and incised with Metzenbaum scissors.  Surgeon's finger was  inserted into the abdominal cavity and the peritoneum was opened  longitudinally along the incision with the Bovie electrocautery with care  taken to push the small bowel out of the way.   A second incision was made for insertion of a 12 mm port approximately 6 cm  to the left of the umbilicus.  Another small incision was made for another  12 mm trocar above the umbilicus, approximately 8 cm.  After making these  incisions in the skin, the lap disk was placed in the hand port and a trocar  with camera was placed through the lap disk.  The trocars were bluntly  pushed through the incisions and passed through the abdominal wall and into  the abdomen using direct vision with a camera through the hand port.  Prior  to this, the abdomen was infused with carbon dioxide to insufflate the  abdomen.  Having placed the hand port and the camera in the working port,  the dissection around the left colon was begun.  Left colon was taken down  systematically using the hook electrocautery to incise along the white line  of Toldt.  This allowed the colon to be reflected medially.  The colon was  further taken down using this entry such that the upper pole and lower pole  of the kidney could be palpated with surgeon's hands.   Next, the hook electrocautery was used to free up the kidney at the upper  pole posteriorly and at the lower pole of the kidney.  Dissection was  further carried out at the lower pole moving from lateral to medial until  the left ureter was identified.  The left ureter was doubly clipped with Hem-  o-lok clips and ligated.  Ureter was dissected out to the  hilum using the  Harmonic scalpel with scissors attachment.  Using blunt dissection with the  Harmonic scalpel the hilar structures were identified.  The vein was  identified first.  Just superior to the vein, the renal artery was  identified.  The adrenal vein was also identified in the superior aspect of  the renal vein.  This was doubly clipped and ligated.  The sucker was used  to bluntly dissect around the renal artery.  Once adequate length was  obtained of the renal artery, two clips were placed proximally and one clip  was placed distally.  The renal vein was assessed and appeared to have no  blood flowing through it after the renal artery had been ligated with clips.  The renal vein was then clipped with three proximal clips and one distal  clip.  The renal vein was incised with scissors and the renal artery was  incised as well.  There appeared to be very little bleeding after incision  of the renal vessels.  There remained some upper pole attachments which were  taken down with the Harmonic scalpel.  There were a few posterior  attachments at the level of the renal hilum which were carefully incised  with the Harmonic scalpel.  Kidney was thus freed entirely and was pulled  through the hand port and passed off the table.  The wound was then  copiously irrigated with sterile saline, carefully inspected for any  bleeding.  There was minimal bleeding at the level of the renal hilum and a  single piece of Surgicel was placed in this area.  Then careful inspection  was performed throughout the area of dissection and there was no bleeding  noted.  The camera was placed once again in the hand port and the two  trocars were removed from the anterior abdominal wall under direct vision.  Two 12 mm ports were closed with a single 2-0 Vicryl within the abdominal  wall musculature.  The hand port was closed with a running #1 PDS.  The skin was closed with a stapling device.  The patient was  then awakened from his  anesthesia without complications and taken to the post anesthesia care unit  in stable condition having tolerated the procedure well.  There were no  complications during the case.   Please note  that Dr. Retta Diones was present and participated in all aspects  of this case as he was primary Careers adviser.     Susanne Borders, MD                           Bertram Millard. Dahlstedt, M.D.    DR/MEDQ  D:  12/22/2002  T:  12/22/2002  Job:  [id number]

## 2010-05-27 NOTE — Cardiovascular Report (Signed)
[place]  Patient:    Cameron [name], NIFONG Visit Number: [id number] MRN: [id number]          Service Type: MED Location: [id number] Attending Physician:  Silvestre Mesi Dictated by:   Darlin Priestly, M.D. Proc. Date: 12/17/00 Admit Date:  12/15/2000   CC:         Rosalyn Gess. Norins, M.D. [facility]  Darlin Priestly, M.D.   Cardiac Catheterization  PROCEDURES: 1. Left heart catheterization. 2. Coronary angiography. 3. Left ventriculogram.  COMPLICATIONS: None.  INDICATIONS: The patient is a 77 year old male, patient of Dr. Lenise Herald and Dr. Debby Bud, with a history of intermittent chest pain with heart catheterization in October of 2000 revealing 60-70% proximal LAD with subsequent flow evaluation revealing no compromise in flow. He has been treated medically since that time. He was readmitted on December 16, 2000, with recurrent chest pain. He subsequently ruled out for myocardial infarction. He is now referred for repeat catheterization.  DESCRIPTION OF PROCEDURE: After given informed written consent, the patient was brought to the cardiac catheterization lab where his right and left groins were shaved, prepped, and draped in the usual sterile fashion.  ECG monitoring was established.  Using modified Seldinger technique, a #6 French arterial sheath was inserted in the right femoral artery.  A 6 French diagnostic catheter was then used to perform diagnostic angiography.  This reveals a large left main with no significant disease.  The LAD is a medium sized vessel which coursed to the apex and gave rise to one large diagonal branch. The LAD is noted to have 50-60% disease in the proximal portion across and just distal to the takeoff of the first high diagonal. The first diagonal is a medium sized vessel which bifurcates distally and has no significant disease.  The left coronary artery also gave rise to a medium sized ramus  intermedius with no significant disease.  The left circumflex is a medium sized vessel which coursed in the AV groove and gave rise to one obtuse marginal branch. The AV groove circumflex has no significant disease. The first OM is a medium sized vessel with no significant disease.  The right coronary artery is a large vessel which is dominant and gives rise to both the PDA as well as the posterolateral branch. There is no significant disease of the RCA, PDA or posterolateral branch.  LEFT VENTRICULOGRAM: Left ventriculogram reveals preserved EF at 50%.  HEMODYNAMICS: Systemic arterial pressure 119/69, LV systemic pressure 119/12, LVEDP of 14.  CONCLUSIONS: 1. No significant coronary artery disease. 2. Normal left ventricular systolic function. Dictated by:   Darlin Priestly, M.D. Attending Physician:  Silvestre Mesi DD:  12/17/00 TD:  12/17/00 Job: [id number]

## 2011-07-20 ENCOUNTER — Other Ambulatory Visit: Payer: Self-pay | Admitting: Family Medicine

## 2011-07-20 DIAGNOSIS — R479 Unspecified speech disturbances: Secondary | ICD-10-CM

## 2011-07-21 ENCOUNTER — Ambulatory Visit (HOSPITAL_COMMUNITY)
Admission: RE | Admit: 2011-07-21 | Discharge: 2011-07-21 | Disposition: A | Discharge status: Home / Self Care | Payer: Medicare Other | Source: Ambulatory Visit | Attending: Family Medicine | Admitting: Family Medicine

## 2011-07-21 DIAGNOSIS — R479 Unspecified speech disturbances: Secondary | ICD-10-CM

## 2011-07-21 DIAGNOSIS — F29 Unspecified psychosis not due to a substance or known physiological condition: Secondary | ICD-10-CM | POA: Insufficient documentation

## 2011-07-21 DIAGNOSIS — R4789 Other speech disturbances: Secondary | ICD-10-CM | POA: Insufficient documentation

## 2011-07-21 IMAGING — CR DG CHEST 2V
2 series · 2 of 2 positions shown · non-contrast
Comparison: 05/12/2006

CLINICAL DATA: Chest pain.

CHEST - 2 VIEW

[w chest pa]
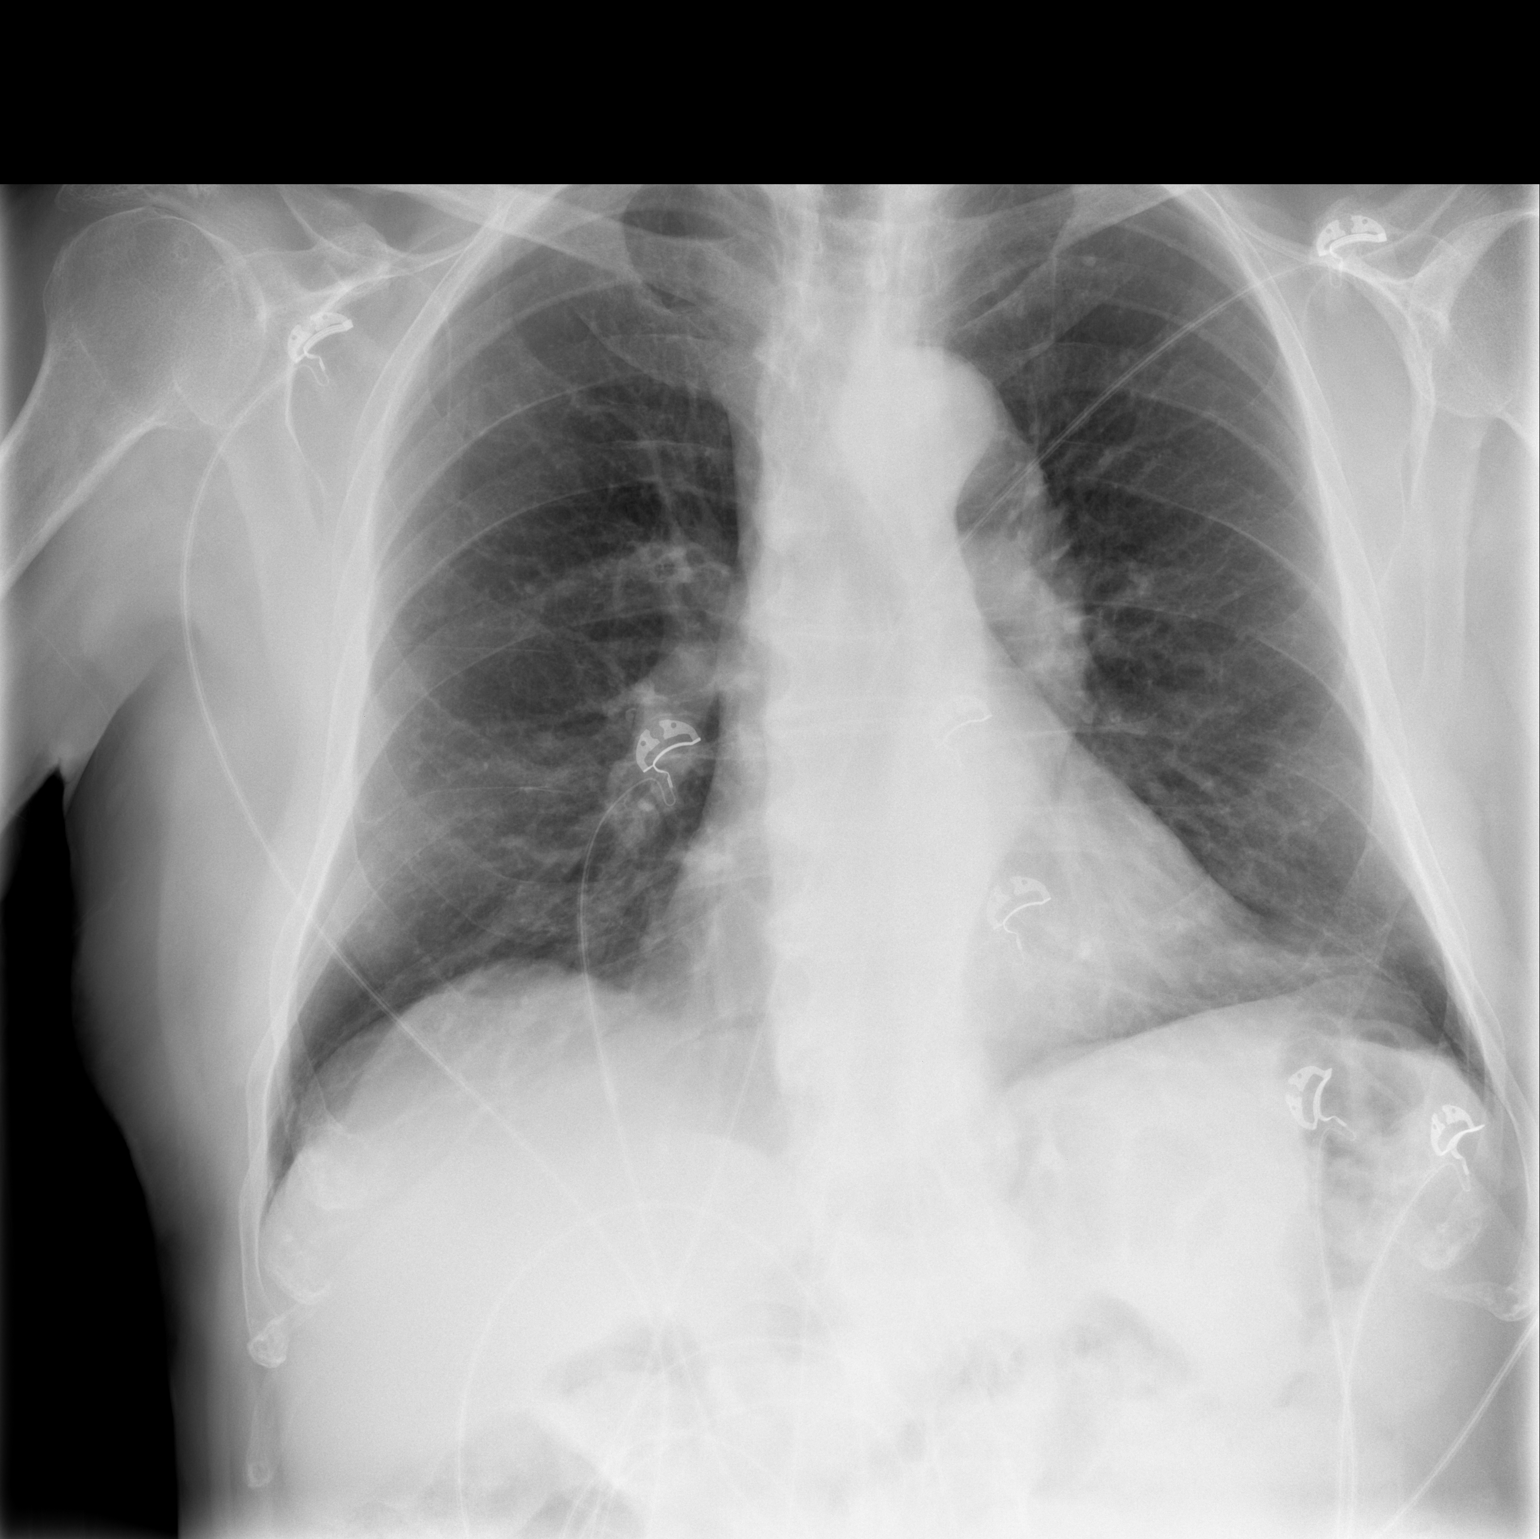

[w chest lat]
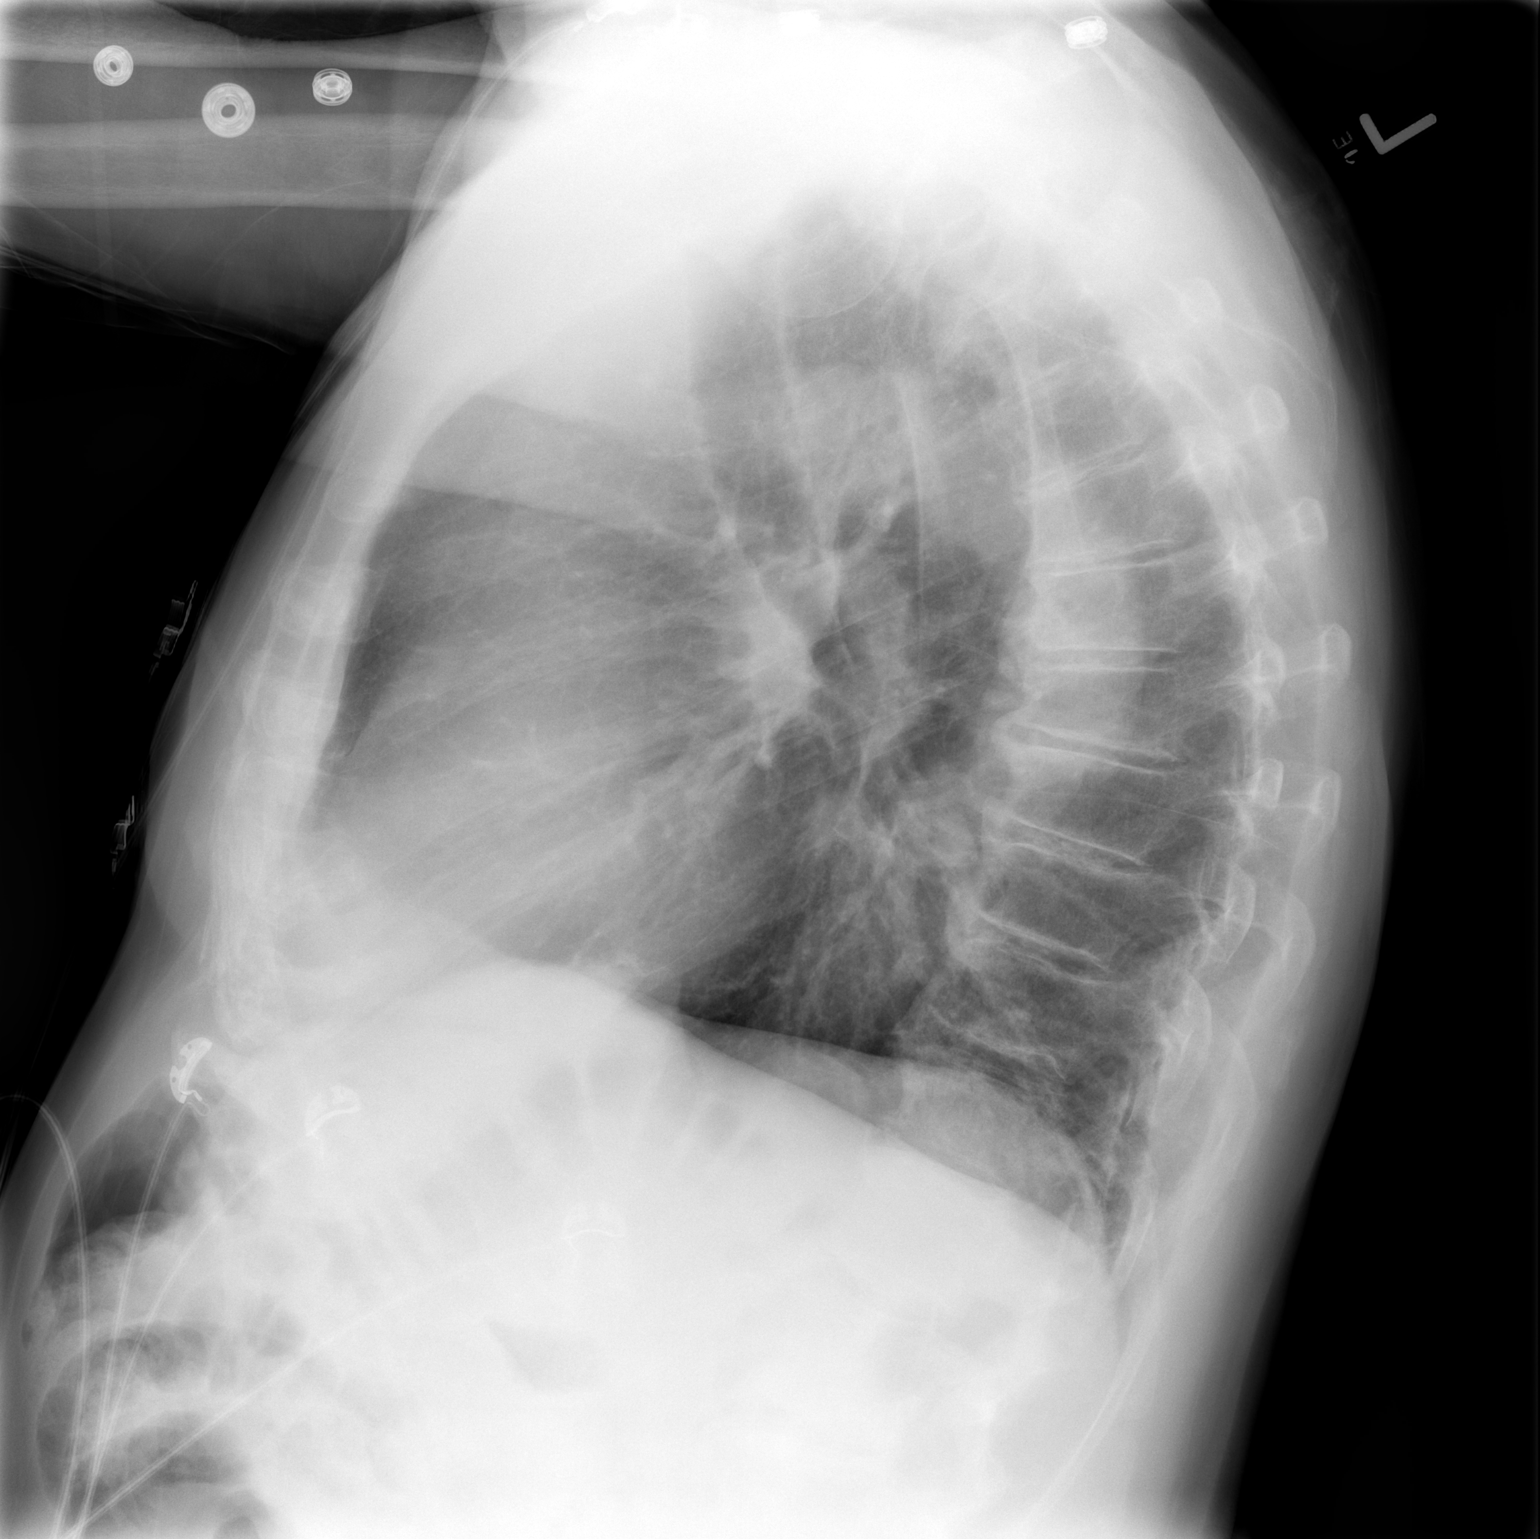

[2 of 2 positions shown; findings below may reference images not displayed]

FINDINGS: Heart and mediastinal contours are within normal limits.
No focal opacities or effusions.  No acute bony abnormality.
IMPRESSION: No active disease.

## 2011-07-23 IMAGING — US US ABDOMEN COMPLETE
1 series · 13 of 25 positions shown · non-contrast
Comparison: CT 11/19/2005.

CLINICAL DATA: History of increase of lipase. History of left
nephrectomy for carcinoma.

ABDOMINAL ULTRASOUND COMPLETE

[Series 1: us abdomen complete · 0.30mm/px · 13 of 41 slices shown]
[im 1/41]
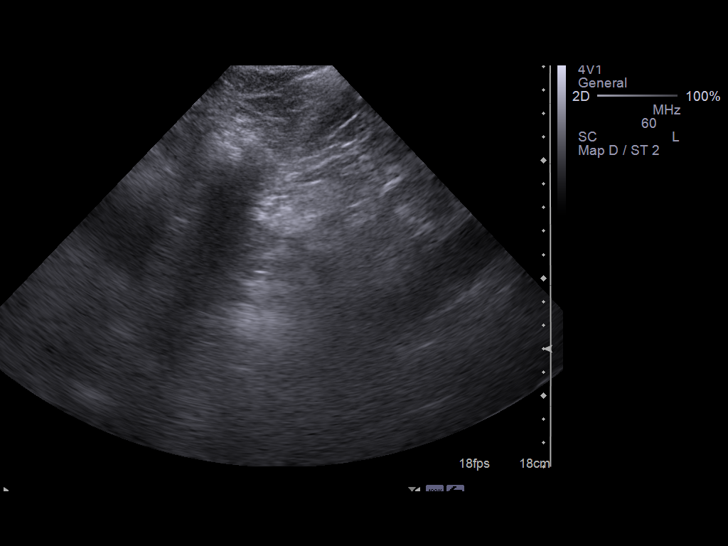
[im 4/41]
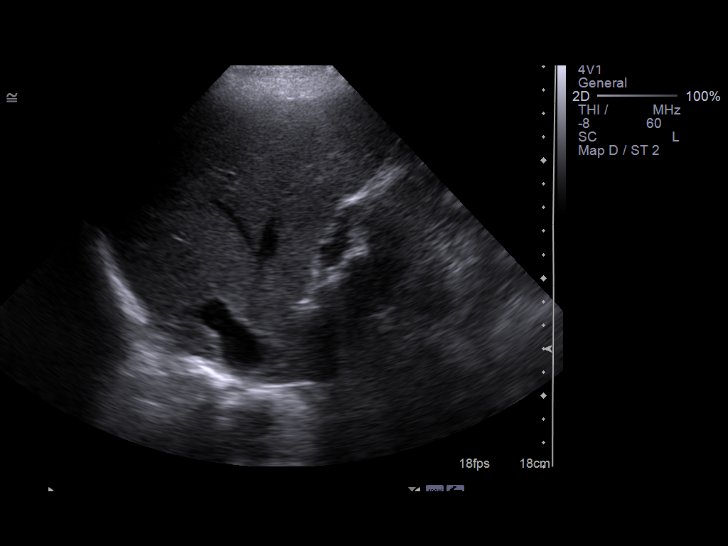
[im 7/41]
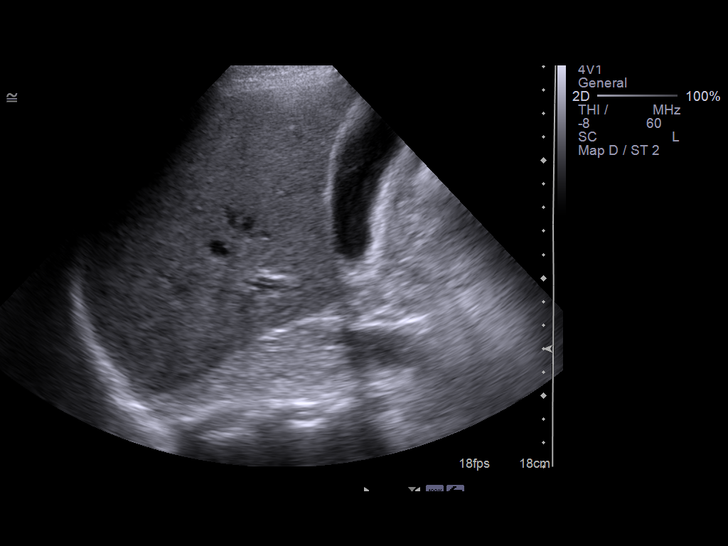
[im 11/41]
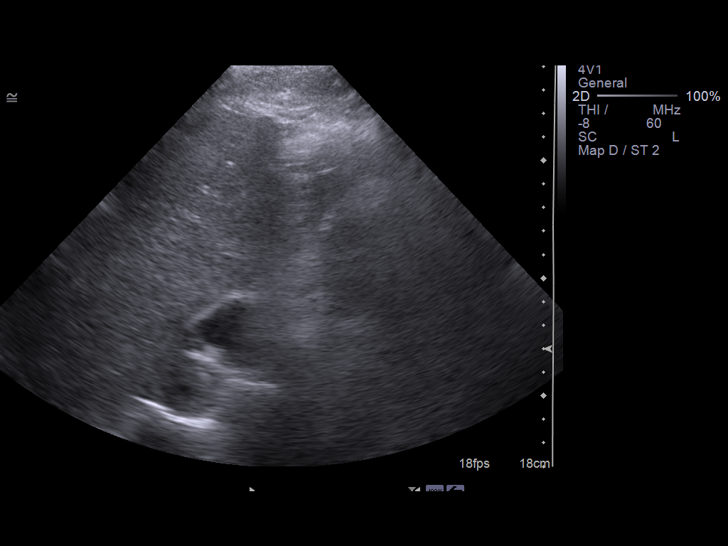
[im 14/41]
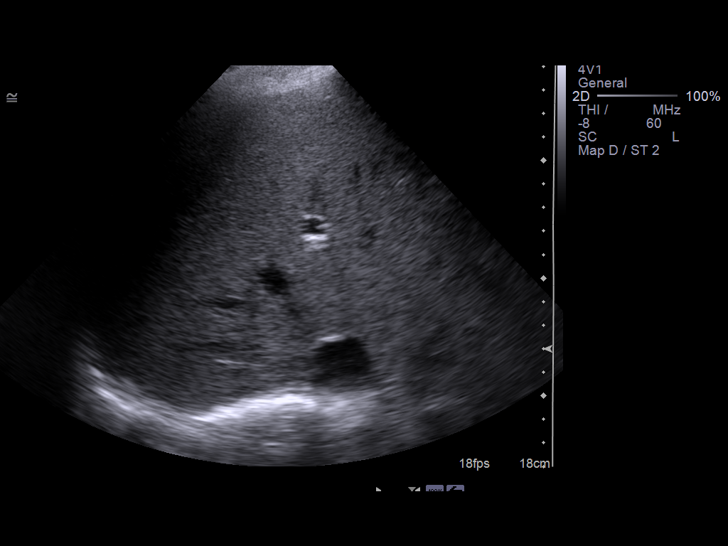
[im 17/41]
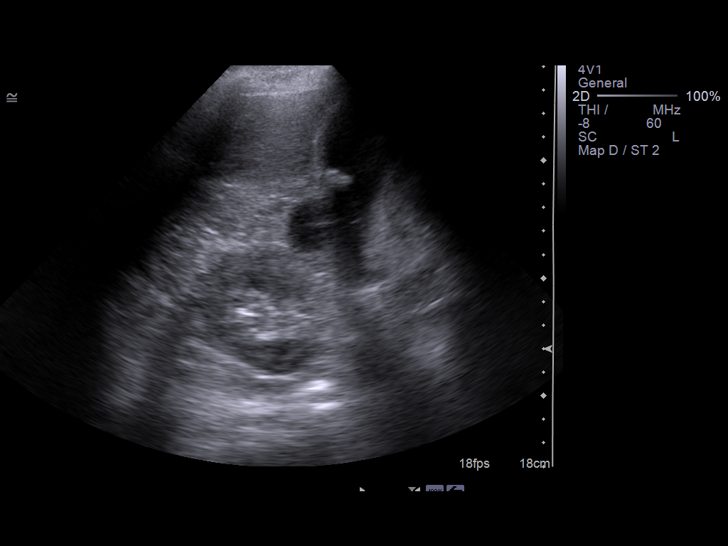
[im 21/41]
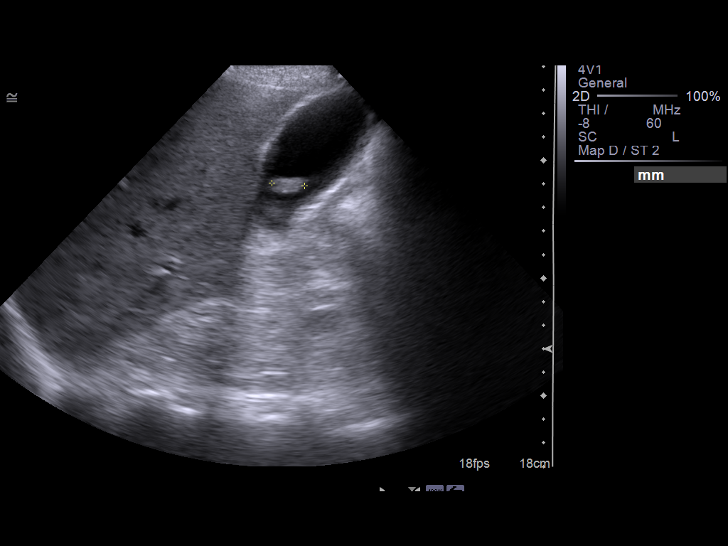
[im 24/41]
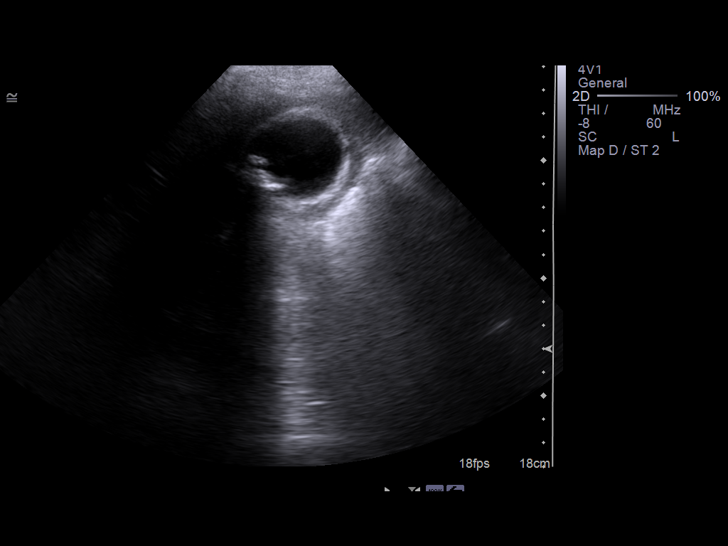
[im 27/41]
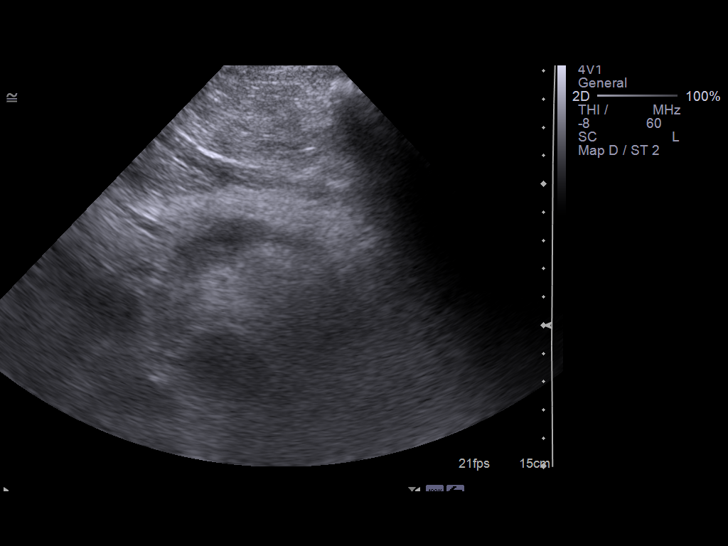
[im 31/41]
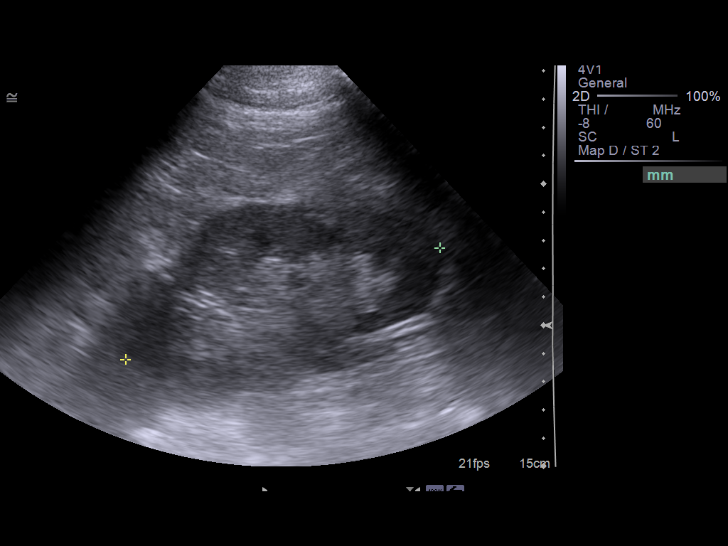
[im 34/41]
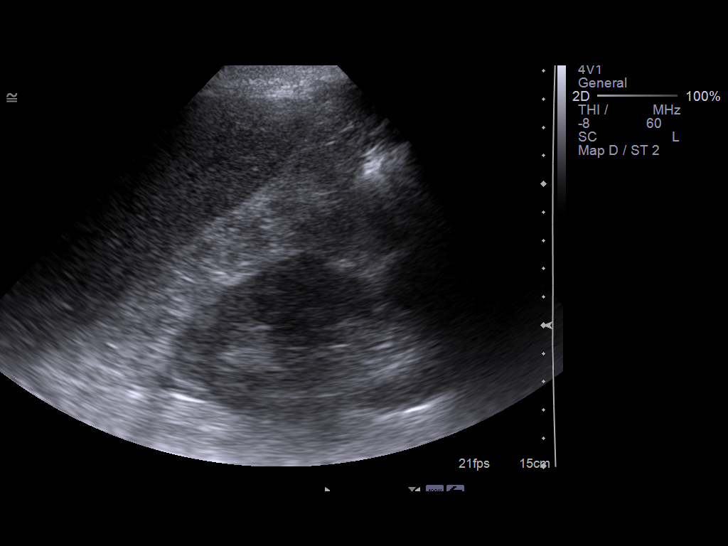
[im 37/41]
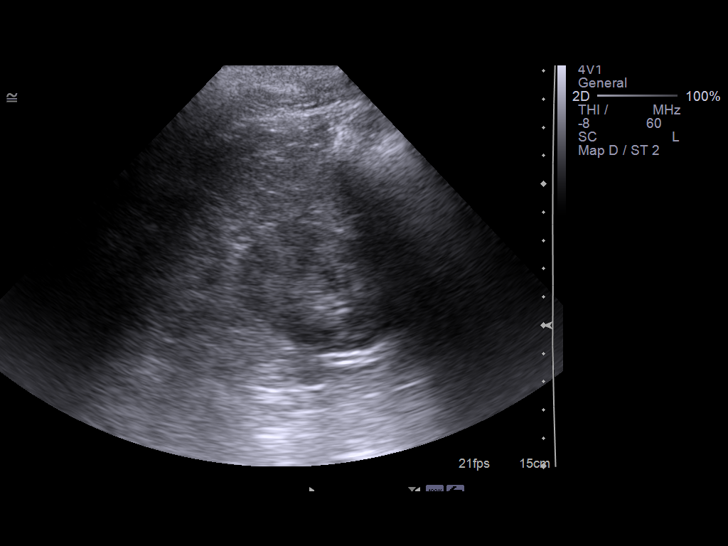
[im 41/41]
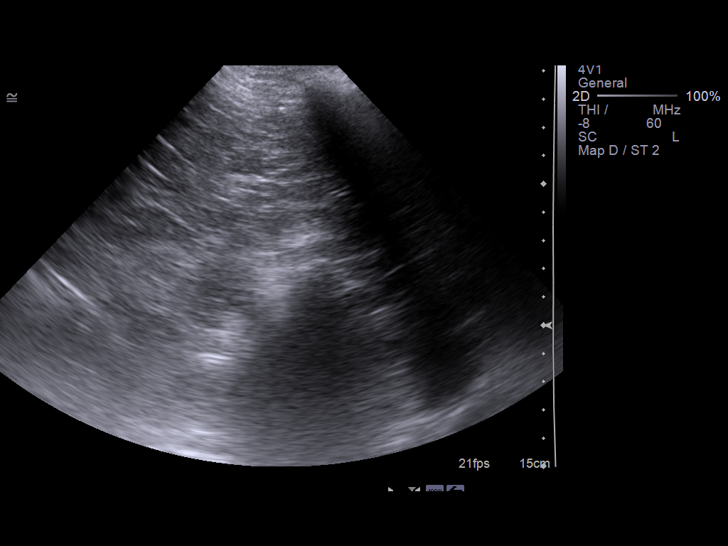

[13 of 25 positions shown; findings below may reference images not displayed]

FINDINGS: Gallbladder: Several gallstones are seen within the gallbladder.
The largest has a diameter of 1.4 cm.  No gallbladder wall
thickening or pericholecystic fluid. The gallbladder wall thickness
measured 1.9 mm. No sonographic Murphy's sign according to the
ultrasound technologist.

CBD: Normal in caliber measuring 4.3 mm. No choledocholithiasis is
evident.

Liver:  Normal size and echotexture without focal parenchymal
abnormality.

IVC:  Patent throughout its visualized course in the abdomen.

Pancreas:  Although the pancreas is difficult to visualize in its
entirety, no focal pancreatic abnormality is identified. No
pseudocyst or fluid around the pancreas was evident.  No pancreatic
calcifications are demonstrated.

Spleen:  Normal size and echotexture without focal abnormality.
Splenic length is 7.3 cm.

Right kidney:  No hydronephrosis.  Well-preserved cortex.  Normal
parenchymal echotexture is seen. A cyst of the midportion of the
right kidney is seen.  The cyst measures 3.2 x 2.7 x 2.6 cm.  Right
renal length is 11.8 cm.

Left kidney:  History given of previous left nephrectomy.  No left
kidney was identified.

Aorta:  Maximum diameter is 2.4 cm.  No aneurysm is evident.

Ascites:  None.
IMPRESSION: There is cholelithiasis.  No gallbladder wall thickening or
pericholecystic fluid is present.  No sonographic Murphy's sign is
present.

No pancreatic enlargement or pseudocyst is seen.  No fluid is seen
around the pancreas.

Previous left nephrectomy has been performed.

A right renal cyst is present.

## 2012-03-14 DIAGNOSIS — F05 Delirium due to known physiological condition: Secondary | ICD-10-CM | POA: Insufficient documentation

## 2012-03-14 DIAGNOSIS — F329 Major depressive disorder, single episode, unspecified: Secondary | ICD-10-CM | POA: Insufficient documentation

## 2012-03-14 DIAGNOSIS — C649 Malignant neoplasm of unspecified kidney, except renal pelvis: Secondary | ICD-10-CM | POA: Insufficient documentation

## 2012-05-14 ENCOUNTER — Emergency Department (HOSPITAL_COMMUNITY): Payer: Medicare Other

## 2012-05-14 ENCOUNTER — Encounter (HOSPITAL_COMMUNITY): Payer: Self-pay | Admitting: *Deleted

## 2012-05-14 ENCOUNTER — Emergency Department (HOSPITAL_COMMUNITY)
Admission: EM | Admit: 2012-05-14 | Discharge: 2012-05-14 | Disposition: A | Discharge status: Home / Self Care | Payer: Medicare Other | Attending: Emergency Medicine | Admitting: Emergency Medicine

## 2012-05-14 DIAGNOSIS — Z880 Allergy status to penicillin: Secondary | ICD-10-CM | POA: Insufficient documentation

## 2012-05-14 DIAGNOSIS — F0390 Unspecified dementia without behavioral disturbance: Secondary | ICD-10-CM | POA: Insufficient documentation

## 2012-05-14 DIAGNOSIS — Z8546 Personal history of malignant neoplasm of prostate: Secondary | ICD-10-CM | POA: Insufficient documentation

## 2012-05-14 DIAGNOSIS — E860 Dehydration: Secondary | ICD-10-CM | POA: Insufficient documentation

## 2012-05-14 HISTORY — DX: Malignant (primary) neoplasm, unspecified: C80.1

## 2012-05-14 HISTORY — DX: Unspecified dementia, unspecified severity, without behavioral disturbance, psychotic disturbance, mood disturbance, and anxiety: F03.90

## 2012-05-14 LAB — CBC WITH DIFFERENTIAL/PLATELET
Basophils Relative: 1 % (ref 0–1)
Eosinophils Absolute: 0.3 10*3/uL (ref 0.0–0.7)
Eosinophils Relative: 4 % (ref 0–5)
MCH: 30.2 pg (ref 26.0–34.0)
MCHC: 33.8 g/dL (ref 30.0–36.0)
MCV: 89.3 fL (ref 78.0–100.0)
Neutrophils Relative %: 70 % (ref 43–77)
Platelets: 219 10*3/uL (ref 150–400)
RDW: 13.9 % (ref 11.5–15.5)

## 2012-05-14 LAB — URINE MICROSCOPIC-ADD ON

## 2012-05-14 LAB — COMPREHENSIVE METABOLIC PANEL
ALT: 14 U/L (ref 0–53)
Albumin: 4 g/dL (ref 3.5–5.2)
Alkaline Phosphatase: 71 U/L (ref 39–117)
Calcium: 9.3 mg/dL (ref 8.4–10.5)
GFR calc Af Amer: 51 mL/min — ABNORMAL LOW (ref >90)
Glucose, Bld: 91 mg/dL (ref 70–99)
Potassium: 4.1 mEq/L (ref 3.5–5.1)
Sodium: 141 mEq/L (ref 135–145)
Total Protein: 6.8 g/dL (ref 6.0–8.3)

## 2012-05-14 LAB — URINALYSIS, ROUTINE W REFLEX MICROSCOPIC
Bilirubin Urine: NEGATIVE
Glucose, UA: NEGATIVE mg/dL
Ketones, ur: NEGATIVE mg/dL
Protein, ur: NEGATIVE mg/dL
pH: 5 (ref 5.0–8.0)

## 2012-05-14 MED ORDER — SODIUM CHLORIDE 0.9 % IV BOLUS (SEPSIS)
500.0000 mL | Freq: Once | INTRAVENOUS | Status: AC
  Administered 2012-05-14: 1000 mL via INTRAVENOUS

## 2012-05-14 NOTE — ED Provider Notes (Signed)
History     CSN: [id number]  Arrival date & time 05/14/12  1711   First MD Initiated Contact with Patient 05/14/12 1724      Chief Complaint  Patient presents with  . Altered Mental Status    (Consider location/radiation/quality/duration/timing/severity/associated sxs/prior treatment) Patient is a 79 y.o. male presenting with altered mental status. The history is provided by the spouse (the wife states he was confused this weekend but is back to normal now). No language interpreter was used.  Altered Mental Status This is a recurrent problem. The current episode started 2 days ago. The problem occurs rarely. The problem has been resolved. Pertinent negatives include no chest pain, no abdominal pain and no headaches. Nothing aggravates the symptoms. Nothing relieves the symptoms.    Past Medical History  Diagnosis Date  . Dementia   . Cancer     Past Surgical History  Procedure Laterality Date  . Nephrectomy    . Cholecystectomy    . Prostate cancer surgery      History reviewed. No pertinent family history.  History  Substance Use Topics  . Smoking status: Never Smoker   . Smokeless tobacco: Current User    Types: Chew  . Alcohol Use: No      Review of Systems  Constitutional: Negative for appetite change and fatigue.  HENT: Negative for congestion, sinus pressure and ear discharge.   Eyes: Negative for discharge.  Respiratory: Negative for cough.   Cardiovascular: Negative for chest pain.  Gastrointestinal: Negative for abdominal pain and diarrhea.  Genitourinary: Negative for frequency and hematuria.  Musculoskeletal: Negative for back pain.  Skin: Negative for rash.  Neurological: Negative for seizures and headaches.       Confusion  Psychiatric/Behavioral: Positive for altered mental status. Negative for hallucinations.    Allergies  Contrast media and Penicillins  Home Medications  No current outpatient prescriptions on file.  BP 144/89  Pulse 71   Temp(Src) 98.7 F (37.1 C) (Oral)  Resp 18  Ht 5\' 10"  (1.778 m)  Wt 172 lb (78.019 kg)  BMI 24.68 kg/m2  SpO2 98%  Physical Exam  Constitutional: He appears well-developed.  HENT:  Head: Normocephalic.  Eyes: Conjunctivae and EOM are normal. No scleral icterus.  Neck: Neck supple. No thyromegaly present.  Cardiovascular: Normal rate and regular rhythm.  Exam reveals no gallop and no friction rub.   No murmur heard. Pulmonary/Chest: No stridor. He has no wheezes. He has no rales. He exhibits no tenderness.  Abdominal: He exhibits no distension. There is no tenderness. There is no rebound.  Musculoskeletal: Normal range of motion. He exhibits no edema.  Lymphadenopathy:    He has no cervical adenopathy.  Neurological: Coordination normal.  Mildly confused.  Oriented to person,place not situation  Skin: No rash noted. No erythema.  Psychiatric: He has a normal mood and affect. His behavior is normal.    ED Course  Procedures (including critical care time)  Labs Reviewed  COMPREHENSIVE METABOLIC PANEL - Abnormal; Notable for the following:    Creatinine, Ser 1.43 (*)    GFR calc non Af Amer 44 (*)    GFR calc Af Amer 51 (*)    All other components within normal limits  URINALYSIS, ROUTINE W REFLEX MICROSCOPIC - Abnormal; Notable for the following:    Specific Gravity, Urine >1.030 (*)    Hgb urine dipstick TRACE (*)    All other components within normal limits  CBC WITH DIFFERENTIAL  URINE MICROSCOPIC-ADD ON  Dg Chest 2 View  05/14/2012  *RADIOLOGY REPORT*  Clinical Data: Weakness, fatigue.  CHEST - 2 VIEW  Comparison: 12/29/2008  Findings: Tortuous thoracic aorta.  Degenerative spurring in the mid thoracic spine.  Lungs are hyperinflated with attenuated bronchovascular markings.  No focal infiltrate.  No effusion. Heart size normal.  IMPRESSION:  Hyperinflation without acute or superimposed abnormality.   Original Report Authenticated By: D. Andria Rhein, MD    Ct Head Wo  Contrast  05/14/2012  *RADIOLOGY REPORT*  Clinical Data: Altered level of consciousness with confusion.  CT HEAD WITHOUT CONTRAST  Technique:  Contiguous axial images were obtained from the base of the skull through the vertex without contrast.  Comparison: 07/21/2011 MR.  Findings: No intracranial hemorrhage.  Small vessel disease type changes without CT evidence of large acute infarct.  Density of vessels felt to be related to atherosclerotic type changes.  Global atrophy without hydrocephalus.  No intracranial mass lesion detected on this unenhanced exam.  Orbital structures unremarkable.  IMPRESSION: No acute abnormality.  Please see above.   Original Report Authenticated By: Lacy Duverney, M.D.      1. Dehydration   2. Dementia       MDM          Benny Lennert, MD 05/14/12 2001

## 2012-05-14 NOTE — ED Notes (Signed)
Patient eating

## 2012-05-14 NOTE — ED Notes (Signed)
Confusion since Monday, Had been in [place] on a trip.No pain, No vomiting.  Decreased intake. Feels tired.

## 2012-05-15 ENCOUNTER — Encounter: Payer: Self-pay | Admitting: *Deleted

## 2012-05-17 ENCOUNTER — Encounter: Payer: Self-pay | Admitting: Family Medicine

## 2012-05-17 ENCOUNTER — Ambulatory Visit (INDEPENDENT_AMBULATORY_CARE_PROVIDER_SITE_OTHER): Payer: Medicare Other | Admitting: Family Medicine

## 2012-05-17 VITALS — BP 124/90 | Temp 97.9°F | Wt 173.0 lb

## 2012-05-17 DIAGNOSIS — F411 Generalized anxiety disorder: Secondary | ICD-10-CM

## 2012-05-17 DIAGNOSIS — R5383 Other fatigue: Secondary | ICD-10-CM

## 2012-05-17 DIAGNOSIS — R5381 Other malaise: Secondary | ICD-10-CM

## 2012-05-17 DIAGNOSIS — R413 Other amnesia: Secondary | ICD-10-CM

## 2012-05-17 MED ORDER — ALPRAZOLAM 0.5 MG PO TABS: ORAL_TABLET | ORAL | Status: DC

## 2012-05-17 NOTE — Progress Notes (Signed)
  Subjective:    Patient ID: Cameron [name], male    DOB: 09-20-1928, 79 y.o.   MRN: [id number]  HPI Patient with significant fatigue and tiredness. He describes it as being all the time but his wife states it's on the days where he doesn't get good sleep the night before. She states that they went on an overnight trip to a resort in he got very confused at night when he went to the bathroom and it upset him greatly and he did not sleep well that night she also states that last night he laid in bed for several hours before falling asleep and was very tired today that made him feel fatigued. He denies any chest pressure tightness pain denies shortness of breath nausea vomiting or diarrhea he states his energy level is fair. Past medical history heart disease, Dementia Family history noncontributory social doesn't smoke   Review of Systems Denies chest pain vomiting diarrhea hematuria rectal bleeding denies fever chills    Objective:   Physical Exam Neck no masses lungs are clear no crackles heart regular. Abdomen soft. No guarding or rebound extremities no edema skin warm dry patient is a propria in his discussions.       Assessment & Plan:  #1 severe fatigue-could be related to poor sleep. I don't find any evidence of any type of tumor infections or other illness going on. We will check some lab work including thyroid but otherwise maintain current medications diet and followup in 3 months #2 insomnia I talked with family about the use of medications both the patient and his wife agrees that occasional use would be as much as they would do Xanax 0.5 mg one at nighttime as needed for rest #3 intermittent anxiety when necessary may use a half of a Xanax to help with anxiety cautioned drowsiness #4 dementia yeah stable but it is apparent that new surroundings cause significant disorientation so therefore they will try to limit trips away from home better overnight Followup in 3-4 months

## 2012-05-28 ENCOUNTER — Other Ambulatory Visit: Payer: Self-pay | Admitting: Cardiovascular Disease

## 2012-05-28 NOTE — Telephone Encounter (Signed)
Rx was sent to pharmacy electronically. 

## 2012-05-30 ENCOUNTER — Telehealth: Payer: Self-pay | Admitting: *Deleted

## 2012-05-30 NOTE — Telephone Encounter (Signed)
Daughter here for her B 12 injection.   She stated that her father, who has dementia and is having increased hallucinations and is wondering re: his medications.  Taking namenda 28 mg daily and exelon patch daily.  He has appt on 08-08-12 with Dr. Anne Hahn (as transfer pt from Dr. Sandria Manly).  PLease advise.

## 2012-05-30 NOTE — Telephone Encounter (Signed)
I called the daughter. The patient has had some occasional hallucinations, seeing things run on front of him. The episodes are transient, and do not upset him. I would not treat with anti-psychotic medications at this time.

## 2012-06-06 ENCOUNTER — Telehealth: Payer: Self-pay | Admitting: Neurology

## 2012-06-06 NOTE — Telephone Encounter (Signed)
I sent to [facility], with Dr. Anne Hahn if possibility.

## 2012-06-07 ENCOUNTER — Encounter: Payer: Self-pay | Admitting: Neurology

## 2012-06-07 ENCOUNTER — Ambulatory Visit (INDEPENDENT_AMBULATORY_CARE_PROVIDER_SITE_OTHER): Payer: Medicare Other | Admitting: Neurology

## 2012-06-07 VITALS — BP 134/86 | HR 73 | Ht 71.0 in | Wt 173.0 lb

## 2012-06-07 DIAGNOSIS — R443 Hallucinations, unspecified: Secondary | ICD-10-CM

## 2012-06-07 DIAGNOSIS — F05 Delirium due to known physiological condition: Secondary | ICD-10-CM

## 2012-06-07 DIAGNOSIS — F329 Major depressive disorder, single episode, unspecified: Secondary | ICD-10-CM

## 2012-06-07 DIAGNOSIS — C649 Malignant neoplasm of unspecified kidney, except renal pelvis: Secondary | ICD-10-CM

## 2012-06-07 DIAGNOSIS — R413 Other amnesia: Secondary | ICD-10-CM

## 2012-06-07 HISTORY — DX: Hallucinations, unspecified: R44.3

## 2012-06-07 MED ORDER — OLANZAPINE 2.5 MG PO TABS: 2.5000 mg | ORAL_TABLET | Freq: Every day | ORAL | Status: DC

## 2012-06-07 NOTE — Progress Notes (Signed)
Reason for visit: Memory disturbance  Cameron [name] is an 79 y.o. male  History of present illness:  Cameron [name] is an 79 year old right-handed white male with a history of a progressive dementing illness consistent with Alzheimer's disease. The patient has been on low-dose Exelon, as he has not been able tolerate the higher dose. The patient is also on Namenda. The patient continues to progress with his memory. The patient is a retired Visual merchandiser, and he has had hallucinations with cattle running through the house. The patient more recently has been seeing a lot of people in the yard, and this is starting to upset him. The patient currently does not operate a motor vehicle. The patient fortunately is sleeping fairly well at night. The patient will get up once to the bathroom, but he does not report hallucinations during the evenings. The patient will hallucinate throughout the day. The patient comes to this office for an evaluation.  Past Medical History  Diagnosis Date  . Dementia   . Hyperlipidemia   . Hallucination 06/07/2012  . Hypertension   . Myocardial infarction   . Cancer     Prostate and Kidney cancer    Past Surgical History  Procedure Laterality Date  . Nephrectomy      Renal cell carcinoma  . Cholecystectomy    . Prostate cancer surgery      Family History  Problem Relation Age of Onset  . Cancer Mother   . Heart attack Father     Social history:  reports that he quit smoking about 40 years ago. His smokeless tobacco use includes Chew. He reports that he does not drink alcohol or use illicit drugs.  Allergies:  Allergies  Allergen Reactions  . Contrast Media (Iodinated Diagnostic Agents)   . Penicillins     Medications:  Current Outpatient Prescriptions on File Prior to Visit  Medication Sig Dispense Refill  . ALPRAZolam (XANAX) 0.5 MG tablet 1/2 during the day as needed every 6 hours prn anxiety, 1 qhs prn  30 tablet  1  . aspirin EC 81 MG tablet Take 81  mg by mouth at bedtime.      . Coenzyme Q10 (CO Q 10) 60 MG CAPS Take 1 capsule by mouth daily.      . fish oil-omega-3 fatty acids 1000 MG capsule Take 1 g by mouth 2 (two) times daily.      Marland Kitchen latanoprost (XALATAN) 0.005 % ophthalmic solution Place 1 drop into both eyes at bedtime.      . Memantine HCl ER (NAMENDA XR) 28 MG CP24 Take 1 capsule by mouth daily.      . nitroGLYCERIN (NITROSTAT) 0.4 MG SL tablet Place 0.4 mg under the tongue every 5 (five) minutes as needed for chest pain.      . pravastatin (PRAVACHOL) 20 MG tablet TAKE ONE TABLET BY MOUTH AT BEDTIME.  30 tablet  10  . rivastigmine (EXELON) 4.6 mg/24hr Place 1 patch onto the skin daily.      . vitamin E 1000 UNIT capsule Take 2,000 Units by mouth daily.       No current facility-administered medications on file prior to visit.    ROS:  Out of a complete 14 system review of symptoms, the patient complains only of the following symptoms, and all other reviewed systems are negative.  Fatigue Hearing loss Loss of vision Cough Constipation Increased thirst Memory loss, confusion, weakness Decreased energy, hallucinations  Blood pressure 134/86, pulse 73, height 5\' 11"  (  1.803 m), weight 173 lb (78.472 kg).  Physical Exam  General: The patient is alert and cooperative at the time of the examination.  Skin: No significant peripheral edema is noted.   Neurologic Exam  Mental status: Mini-Mental status examination done today shows a total score of 15/30. The patient is able to name 9 animals in 60 seconds.  Cranial nerves: Facial symmetry is present. Speech is normal, no aphasia or dysarthria is noted. Extraocular movements are full. Visual fields are full.  Motor: The patient has good strength in all 4 extremities.  Coordination: The patient has good finger-nose-finger on the right, with severe apraxia with the use of the left upper extremity and both lower extremities.  Gait and station: The patient has a slightly  wide-based gait. Tandem gait was not attempted. Romberg is negative. No drift is seen.  Reflexes: Deep tendon reflexes are symmetric.   Assessment/Plan:  1. Progressive dementia  2. Visual hallucinations  The patient is having increasing problems with hallucinations that are now becoming a problem for the patient, causing some agitation. The patient will be placed on low-dose Zyprexa. In the past, the patient has not tolerated Seroquel. The patient is on low-dose alprazolam, and this probably should be discontinued as well, as it is not clear that it is helping his agitation and hallucinations. The patient followup in 4 months.  Cameron Palau MD 06/08/2012 7:18 PM  [facility] [address] [place], [place] [address]  Phone [phone] Fax [phone]

## 2012-06-12 ENCOUNTER — Telehealth: Payer: Self-pay | Admitting: Neurology

## 2012-06-12 NOTE — Telephone Encounter (Signed)
I called patient. I talked with the wife. The patient is still having hallucinations all day long, particularly worse at night. We will go up on the Zyprexa taking 2.5 mg twice daily. If this is not effective after several days, they may go to 2.5 mg in the morning, and 5 mg in the evening.

## 2012-06-12 NOTE — Telephone Encounter (Signed)
Patient is still having hallucinations. Patient's spouse would like to speak with physician.

## 2012-06-15 ENCOUNTER — Emergency Department (HOSPITAL_COMMUNITY): Payer: Medicare Other

## 2012-06-15 ENCOUNTER — Emergency Department (HOSPITAL_COMMUNITY)
Admission: EM | Admit: 2012-06-15 | Discharge: 2012-06-15 | Disposition: A | Discharge status: Home / Self Care | Payer: Medicare Other | Attending: Emergency Medicine | Admitting: Emergency Medicine

## 2012-06-15 DIAGNOSIS — Z8546 Personal history of malignant neoplasm of prostate: Secondary | ICD-10-CM | POA: Insufficient documentation

## 2012-06-15 DIAGNOSIS — Z85528 Personal history of other malignant neoplasm of kidney: Secondary | ICD-10-CM | POA: Insufficient documentation

## 2012-06-15 DIAGNOSIS — Z7982 Long term (current) use of aspirin: Secondary | ICD-10-CM | POA: Insufficient documentation

## 2012-06-15 DIAGNOSIS — F0280 Dementia in other diseases classified elsewhere without behavioral disturbance: Secondary | ICD-10-CM

## 2012-06-15 DIAGNOSIS — E785 Hyperlipidemia, unspecified: Secondary | ICD-10-CM | POA: Insufficient documentation

## 2012-06-15 DIAGNOSIS — I252 Old myocardial infarction: Secondary | ICD-10-CM | POA: Insufficient documentation

## 2012-06-15 DIAGNOSIS — Z79899 Other long term (current) drug therapy: Secondary | ICD-10-CM | POA: Insufficient documentation

## 2012-06-15 DIAGNOSIS — Z880 Allergy status to penicillin: Secondary | ICD-10-CM | POA: Insufficient documentation

## 2012-06-15 DIAGNOSIS — Z87891 Personal history of nicotine dependence: Secondary | ICD-10-CM | POA: Insufficient documentation

## 2012-06-15 DIAGNOSIS — F0390 Unspecified dementia without behavioral disturbance: Secondary | ICD-10-CM | POA: Insufficient documentation

## 2012-06-15 DIAGNOSIS — I10 Essential (primary) hypertension: Secondary | ICD-10-CM | POA: Insufficient documentation

## 2012-06-15 DIAGNOSIS — R443 Hallucinations, unspecified: Secondary | ICD-10-CM | POA: Insufficient documentation

## 2012-06-15 LAB — URINALYSIS, ROUTINE W REFLEX MICROSCOPIC
Glucose, UA: NEGATIVE mg/dL
Hgb urine dipstick: NEGATIVE
Ketones, ur: NEGATIVE mg/dL
Protein, ur: NEGATIVE mg/dL
Urobilinogen, UA: 0.2 mg/dL (ref 0.0–1.0)

## 2012-06-15 LAB — COMPREHENSIVE METABOLIC PANEL
AST: 14 U/L (ref 0–37)
Albumin: 3.6 g/dL (ref 3.5–5.2)
Alkaline Phosphatase: 63 U/L (ref 39–117)
BUN: 19 mg/dL (ref 6–23)
CO2: 26 mEq/L (ref 19–32)
Chloride: 101 mEq/L (ref 96–112)
Creatinine, Ser: 1.46 mg/dL — ABNORMAL HIGH (ref 0.50–1.35)
GFR calc non Af Amer: 43 mL/min — ABNORMAL LOW (ref >90)
Potassium: 4.1 mEq/L (ref 3.5–5.1)
Total Bilirubin: 0.5 mg/dL (ref 0.3–1.2)

## 2012-06-15 LAB — CBC WITH DIFFERENTIAL/PLATELET
Basophils Absolute: 0.1 10*3/uL (ref 0.0–0.1)
Basophils Relative: 2 % — ABNORMAL HIGH (ref 0–1)
HCT: 42.9 % (ref 39.0–52.0)
Hemoglobin: 14.3 g/dL (ref 13.0–17.0)
Lymphocytes Relative: 13 % (ref 12–46)
Monocytes Absolute: 0.5 10*3/uL (ref 0.1–1.0)
Monocytes Relative: 9 % (ref 3–12)
Neutro Abs: 4.1 10*3/uL (ref 1.7–7.7)
Neutrophils Relative %: 73 % (ref 43–77)
WBC: 5.7 10*3/uL (ref 4.0–10.5)

## 2012-06-15 NOTE — ED Notes (Addendum)
Pt here for c/o per family for  hallucination awaken with problem this am. Unable to sleep.Family stated that pt has Hx of hallucination,and placed on med but they are getting worse

## 2012-06-15 NOTE — ED Notes (Signed)
Pt tolerating fluids well. 

## 2012-06-15 NOTE — ED Notes (Signed)
Ambulated well steady on feet

## 2012-06-15 NOTE — ED Provider Notes (Signed)
History     CSN: [id number]  Arrival date & time 06/15/12  0857   First MD Initiated Contact with Patient 06/15/12 [phone]      Chief Complaint  Patient presents with  . Altered Mental Status    (Consider location/radiation/quality/duration/timing/severity/associated sxs/prior treatment) HPI Comments: Patient presents with spouse and daughter with worsening hallucinations over the past day. Is a history of dementia and has been hallucinating for several months that there are people in the house. He recently saw Dr. Anne Hahn of neurology was started on Zyprexa on may 30th. The family has not noticed any improvement. He states he had a restless night and was up to the bathroom frequently. No documented fevers but they thought he felt warm. Good by mouth intake and urine output. Patient is calm and cooperative and denies pain. Wife states she feels safe at home and he has not been violent.  She has taken away the guns in the house.  The history is provided by the patient, the spouse and a relative. The history is limited by the condition of the patient.    Past Medical History  Diagnosis Date  . Dementia   . Hyperlipidemia   . Hallucination 06/07/2012  . Hypertension   . Myocardial infarction   . Cancer     Prostate and Kidney cancer    Past Surgical History  Procedure Laterality Date  . Nephrectomy      Renal cell carcinoma  . Cholecystectomy    . Prostate cancer surgery      Family History  Problem Relation Age of Onset  . Cancer Mother   . Heart attack Father     History  Substance Use Topics  . Smoking status: Former Smoker    Quit date: 05/17/1972  . Smokeless tobacco: Current User    Types: Chew  . Alcohol Use: No      Review of Systems  Unable to perform ROS: Dementia  Psychiatric/Behavioral: Positive for altered mental status.    Allergies  Contrast media and Penicillins  Home Medications   Current Outpatient Rx  Name  Route  Sig  Dispense  Refill  .  aspirin EC 81 MG tablet   Oral   Take 81 mg by mouth at bedtime.         . Coenzyme Q10 300 MG CAPS   Oral   Take 1 capsule by mouth daily.         . fish oil-omega-3 fatty acids 1000 MG capsule   Oral   Take 1 g by mouth 2 (two) times daily.         Marland Kitchen latanoprost (XALATAN) 0.005 % ophthalmic solution   Both Eyes   Place 1 drop into both eyes at bedtime.         . Memantine HCl ER (NAMENDA XR) 28 MG CP24   Oral   Take 1 capsule by mouth daily.         . nitroGLYCERIN (NITROSTAT) 0.4 MG SL tablet   Sublingual   Place 0.4 mg under the tongue every 5 (five) minutes as needed for chest pain.         Marland Kitchen OLANZapine (ZYPREXA) 2.5 MG tablet   Oral   Take 2.5 mg by mouth 2 (two) times daily.         . pravastatin (PRAVACHOL) 20 MG tablet   Oral   Take 20 mg by mouth at bedtime.         . rivastigmine (EXELON) 4.6  mg/24hr   Transdermal   Place 1 patch onto the skin daily.         . vitamin E 1000 UNIT capsule   Oral   Take 2,000 Units by mouth daily.           BP 132/82  Pulse 59  Temp(Src) 97.5 F (36.4 C) (Oral)  Resp 24  SpO2 97%  Physical Exam  Constitutional: He appears well-developed and well-nourished. No distress.  Calm, cooperative, oriented x2  HENT:  Head: Normocephalic and atraumatic.  Mouth/Throat: Oropharynx is clear and moist. No oropharyngeal exudate.  Eyes: Conjunctivae and EOM are normal. Pupils are equal, round, and reactive to light.  Neck: Normal range of motion.  Cardiovascular: Normal rate, regular rhythm and normal heart sounds.   No murmur heard. Pulmonary/Chest: Effort normal and breath sounds normal. No respiratory distress.  Abdominal: Soft. There is no tenderness. There is no rebound and no guarding.  Musculoskeletal: Normal range of motion. He exhibits no edema and no tenderness.  Neurological: He is alert. No cranial nerve deficit. He exhibits normal muscle tone. Coordination normal.  CN 2-12 intact, no ataxia on  finger to nose, no nystagmus, 5/5 strength throughout, no pronator drift,    Skin: Skin is warm.    ED Course  Procedures (including critical care time)  Labs Reviewed  CBC WITH DIFFERENTIAL - Abnormal; Notable for the following:    Basophils Relative 2 (*)    All other components within normal limits  COMPREHENSIVE METABOLIC PANEL - Abnormal; Notable for the following:    Creatinine, Ser 1.46 (*)    GFR calc non Af Amer 43 (*)    GFR calc Af Amer 50 (*)    All other components within normal limits  URINALYSIS, ROUTINE W REFLEX MICROSCOPIC  TROPONIN I   Dg Chest 2 View  06/15/2012   *RADIOLOGY REPORT*  Clinical Data: Altered mental status.  Weakness and tired.  CHEST - 2 VIEW  Comparison: 05/14/2012  Findings: Heart is upper limits normal in size.  The lungs are free of focal consolidations and pleural effusions.  There is minimal left base atelectasis.  No edema.  Moderate degenerative changes are seen in the spine.  IMPRESSION: Minimal left base atelectasis.   Original Report Authenticated By: Norva Pavlov, M.D.   Ct Head Wo Contrast  06/15/2012   *RADIOLOGY REPORT*  Clinical Data: Evaluate change in mental status.  Hallucinations.  CT HEAD WITHOUT CONTRAST  Technique:  Contiguous axial images were obtained from the base of the skull through the vertex without contrast.  Comparison: 05/14/2012  Findings: There is moderate central and cortical atrophy. Periventricular white matter changes are consistent with small vessel disease. There is no evidence for hemorrhage, mass lesion, or acute infarction.  Bone windows show no acute findings. There is probable old fracture of the left zygomatic arch.  IMPRESSION:  1.  Atrophy and small vessel disease. 2. No evidence for acute intracranial abnormality.   Original Report Authenticated By: Norva Pavlov, M.D.     1. 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 History of dementia with many month history of the same. We'll rule out infectious or metabolic problems. Symptoms may represent worsening dementia.  Workup negative for infectious or metabolic cause of symptoms. Creatinine stable.  No UTI or PNA.  CT head negative. Suspect worsening dementia.  Patient has only been on zyprexa for 1  week.   Ambulatory and tolerating PO.  Nonviolent.  Wife and daughter feel safe at home and not threatened. They feel comfortable taking patient home.  Will increase zyprexa to 2.5 mg qAM and 5 mg QHS per Dr. Clarisa Kindred recommendations. Wife and daughter decline home health evaluation and will pursue this on their own.     Date: 06/15/2012  Rate: 61  Rhythm: normal sinus rhythm  QRS Axis: normal  Intervals: PR prolonged  ST/T Wave abnormalities: normal  Conduction Disutrbances:first-degree A-V block   Narrative Interpretation:   Old EKG Reviewed: unchanged    Glynn Octave, MD 06/15/12 [phone]

## 2012-06-17 ENCOUNTER — Telehealth: Payer: Self-pay | Admitting: Neurology

## 2012-06-18 ENCOUNTER — Telehealth: Payer: Self-pay | Admitting: Neurology

## 2012-06-18 MED ORDER — OLANZAPINE 5 MG PO TABS: 5.0000 mg | ORAL_TABLET | Freq: Two times a day (BID) | ORAL | Status: DC

## 2012-06-18 NOTE — Telephone Encounter (Signed)
I

## 2012-06-18 NOTE — Telephone Encounter (Signed)
I spoke to Cameron [name], she stated her father worse.   Had been to the ER and upped the dose of Zyprexa 2.5mg  po am and 5mg  po pm.  Still with hallucinations and agitation,  wanting her uncle to bring a gun over to there house.  I then spoke to wife.  Pt still with hallucinations, had a restful night and seems calmer this am.  Questioning increasing dose of zyprexa.  I questioned guns in the house.  She said no.  I told her that if pt threatening them or himself to call 911.   Would relay to Dr. Anne Hahn.   [phone]

## 2012-06-18 NOTE — Telephone Encounter (Signed)
I called the patient and I talked with the wife. The patient continues to have hallucinations, better in the morning, but worse after 5 PM. The patient fortunately is sleeping well at night. The patient will go up on the Zyprexa taking 5 mg twice daily.

## 2012-06-18 NOTE — Addendum Note (Signed)
Addended by: Stephanie Acre on: 06/18/2012 05:03 PM   Modules accepted: Orders

## 2012-06-19 ENCOUNTER — Encounter (HOSPITAL_COMMUNITY): Payer: Self-pay | Admitting: Emergency Medicine

## 2012-06-19 ENCOUNTER — Emergency Department (HOSPITAL_COMMUNITY)
Admission: EM | Admit: 2012-06-19 | Discharge: 2012-06-19 | Disposition: A | Discharge status: Home / Self Care | Payer: Medicare Other | Attending: Emergency Medicine | Admitting: Emergency Medicine

## 2012-06-19 ENCOUNTER — Ambulatory Visit: Payer: Medicare Other | Admitting: Family Medicine

## 2012-06-19 ENCOUNTER — Telehealth: Payer: Self-pay | Admitting: Neurology

## 2012-06-19 DIAGNOSIS — Z880 Allergy status to penicillin: Secondary | ICD-10-CM | POA: Insufficient documentation

## 2012-06-19 DIAGNOSIS — S30813A Abrasion of scrotum and testes, initial encounter: Secondary | ICD-10-CM

## 2012-06-19 DIAGNOSIS — R443 Hallucinations, unspecified: Secondary | ICD-10-CM | POA: Insufficient documentation

## 2012-06-19 DIAGNOSIS — Y929 Unspecified place or not applicable: Secondary | ICD-10-CM | POA: Insufficient documentation

## 2012-06-19 DIAGNOSIS — I252 Old myocardial infarction: Secondary | ICD-10-CM | POA: Insufficient documentation

## 2012-06-19 DIAGNOSIS — Y939 Activity, unspecified: Secondary | ICD-10-CM | POA: Insufficient documentation

## 2012-06-19 DIAGNOSIS — Z8546 Personal history of malignant neoplasm of prostate: Secondary | ICD-10-CM | POA: Insufficient documentation

## 2012-06-19 DIAGNOSIS — E7800 Pure hypercholesterolemia, unspecified: Secondary | ICD-10-CM | POA: Insufficient documentation

## 2012-06-19 DIAGNOSIS — I10 Essential (primary) hypertension: Secondary | ICD-10-CM | POA: Insufficient documentation

## 2012-06-19 DIAGNOSIS — IMO0002 Reserved for concepts with insufficient information to code with codable children: Secondary | ICD-10-CM | POA: Insufficient documentation

## 2012-06-19 DIAGNOSIS — Z79899 Other long term (current) drug therapy: Secondary | ICD-10-CM | POA: Insufficient documentation

## 2012-06-19 DIAGNOSIS — F0390 Unspecified dementia without behavioral disturbance: Secondary | ICD-10-CM | POA: Insufficient documentation

## 2012-06-19 DIAGNOSIS — Z87891 Personal history of nicotine dependence: Secondary | ICD-10-CM | POA: Insufficient documentation

## 2012-06-19 DIAGNOSIS — Z7982 Long term (current) use of aspirin: Secondary | ICD-10-CM | POA: Insufficient documentation

## 2012-06-19 DIAGNOSIS — X58XXXA Exposure to other specified factors, initial encounter: Secondary | ICD-10-CM | POA: Insufficient documentation

## 2012-06-19 DIAGNOSIS — Z85528 Personal history of other malignant neoplasm of kidney: Secondary | ICD-10-CM | POA: Insufficient documentation

## 2012-06-19 LAB — CBC WITH DIFFERENTIAL/PLATELET
Eosinophils Relative: 4 % (ref 0–5)
Hemoglobin: 15.1 g/dL (ref 13.0–17.0)
Lymphocytes Relative: 12 % (ref 12–46)
Lymphs Abs: 0.9 10*3/uL (ref 0.7–4.0)
MCV: 88.4 fL (ref 78.0–100.0)
Platelets: 208 10*3/uL (ref 150–400)
RBC: 5.09 MIL/uL (ref 4.22–5.81)
WBC: 7 10*3/uL (ref 4.0–10.5)

## 2012-06-19 LAB — URINALYSIS, ROUTINE W REFLEX MICROSCOPIC
Leukocytes, UA: NEGATIVE
Nitrite: NEGATIVE
Specific Gravity, Urine: 1.013 (ref 1.005–1.030)
pH: 5.5 (ref 5.0–8.0)

## 2012-06-19 LAB — BASIC METABOLIC PANEL
CO2: 23 mEq/L (ref 19–32)
Calcium: 9.6 mg/dL (ref 8.4–10.5)
Glucose, Bld: 101 mg/dL — ABNORMAL HIGH (ref 70–99)
Potassium: 4.3 mEq/L (ref 3.5–5.1)
Sodium: 138 mEq/L (ref 135–145)

## 2012-06-19 LAB — PROTIME-INR: INR: 0.9 (ref 0.00–1.49)

## 2012-06-19 MED ORDER — ALPRAZOLAM 1 MG PO TABS: 1.0000 mg | ORAL_TABLET | Freq: Every evening | ORAL | Status: DC | PRN

## 2012-06-19 MED ORDER — SODIUM CHLORIDE 0.9 % IV SOLN
INTRAVENOUS | Status: DC
  Administered 2012-06-19: 20:00:00 via INTRAVENOUS

## 2012-06-19 NOTE — ED Notes (Signed)
Per daughter, history of kidney/prostate cancer-on new med for hallucination

## 2012-06-19 NOTE — Telephone Encounter (Signed)
Patient's daughter is calling back.  She is requesting to speak with Dr. Anne Hahn.  She tells me her Dad can not wait until December for an appointment.  She needs to talk with Dr. Anne Hahn regarding his care.  She says she doesn't expect her Dad to make it until December.  The patient is having "accidents" on himself, not sleeping at all and getting up and down all night long.  She states if Dr. Anne Hahn can call her, she would really appreciate it.  She can be reached at:  [phone]

## 2012-06-19 NOTE — Telephone Encounter (Signed)
Fwd message(s) to Dr. Anne Hahn.

## 2012-06-19 NOTE — Telephone Encounter (Signed)
Cameron [name] states he is going down fast.  Wants to discuss getting a hospital bed.  How can they care for him and keep him at home.  States he is not sleeping.  Getting up at night.  She is very concerned.  They were up all night.  Says he sleeps in the day but not at night.  Wife must have rest as well.

## 2012-06-19 NOTE — ED Provider Notes (Signed)
History    CSN: [id number] Arrival date & time 06/19/12  1813 First MD Initiated Contact with Patient 06/19/12 1824      Chief Complaint  Patient presents with  . Hematuria    HPI Patient presents to the emergency room with complaints of hematuria. Patient has had some trouble with prostate problems in the past. He also had renal cell carcinoma nephrectomy. Patient was complaining of some difficulty urinating today. He then went to go urinate in complaint of lower abdominal discomfort. They noticed grossly bloody urine today. He has not had any trouble with fevers, vomiting, or diarrhea. They were concerned about the blood in his urine considering his past history study was brought to the emergency room.  Patient does have a history of dementia. He has had issues with sleeping poorly and hallucinating. The family has been seeing a neurologist for treatment of his condition. He has started a new medication recently. Past Medical History  Diagnosis Date  . Dementia   . Hyperlipidemia   . Hallucination 06/07/2012  . Hypertension   . Myocardial infarction   . Cancer     Prostate and Kidney cancer    Past Surgical History  Procedure Laterality Date  . Nephrectomy      Renal cell carcinoma  . Cholecystectomy    . Prostate cancer surgery      Family History  Problem Relation Age of Onset  . Cancer Mother   . Heart attack Father     History  Substance Use Topics  . Smoking status: Former Smoker    Quit date: 05/17/1972  . Smokeless tobacco: Current User    Types: Chew  . Alcohol Use: No      Review of Systems  All other systems reviewed and are negative.    Allergies  Contrast media and Penicillins  Home Medications   Current Outpatient Rx  Name  Route  Sig  Dispense  Refill  . aspirin EC 81 MG tablet   Oral   Take 81 mg by mouth at bedtime.         . Coenzyme Q10 300 MG CAPS   Oral   Take 1 capsule by mouth daily.         . fish oil-omega-3 fatty  acids 1000 MG capsule   Oral   Take 1 g by mouth 2 (two) times daily.         Marland Kitchen latanoprost (XALATAN) 0.005 % ophthalmic solution   Both Eyes   Place 1 drop into both eyes at bedtime.         . Memantine HCl ER (NAMENDA XR) 28 MG CP24   Oral   Take 1 capsule by mouth daily.         . nitroGLYCERIN (NITROSTAT) 0.4 MG SL tablet   Sublingual   Place 0.4 mg under the tongue every 5 (five) minutes as needed for chest pain.         Marland Kitchen OLANZapine (ZYPREXA) 10 MG tablet   Oral   Take 5 mg by mouth 2 (two) times daily.         . pravastatin (PRAVACHOL) 20 MG tablet   Oral   Take 20 mg by mouth at bedtime.         . rivastigmine (EXELON) 4.6 mg/24hr   Transdermal   Place 1 patch onto the skin daily.         . vitamin E 1000 UNIT capsule   Oral   Take 2,000 Units  by mouth daily.           BP 138/80  Pulse 82  Temp(Src) 97.9 F (36.6 C) (Oral)  Resp 18  SpO2 98%  Physical Exam  Nursing note and vitals reviewed. Constitutional: No distress.  HENT:  Head: Normocephalic and atraumatic.  Right Ear: External ear normal.  Left Ear: External ear normal.  Eyes: Conjunctivae are normal. Right eye exhibits no discharge. Left eye exhibits no discharge. No scleral icterus.  Neck: Neck supple. No tracheal deviation present.  Cardiovascular: Normal rate, regular rhythm and intact distal pulses.   Pulmonary/Chest: Effort normal and breath sounds normal. No stridor. No respiratory distress. He has no wheezes. He has no rales.  Abdominal: Soft. Bowel sounds are normal. He exhibits no distension. There is no tenderness. There is no rebound and no guarding.  Genitourinary: Penis normal.  Small scab/scratch on scotum  Musculoskeletal: He exhibits no edema and no tenderness.  Neurological: He is alert. He has normal strength. No sensory deficit. Cranial nerve deficit:  no gross defecits noted. He exhibits normal muscle tone. He displays no seizure activity. Coordination normal.   Skin: Skin is warm and dry. No rash noted. He is not diaphoretic.  Psychiatric: He has a normal mood and affect.    ED Course  Procedures (including critical care time)  Labs Reviewed  BASIC METABOLIC PANEL - Abnormal; Notable for the following:    Glucose, Bld 101 (*)    Creatinine, Ser 1.41 (*)    GFR calc non Af Amer 45 (*)    GFR calc Af Amer 52 (*)    All other components within normal limits  URINALYSIS, ROUTINE W REFLEX MICROSCOPIC - Abnormal; Notable for the following:    Hgb urine dipstick SMALL (*)    All other components within normal limits  URINE CULTURE  CBC WITH DIFFERENTIAL  PROTIME-INR  URINE MICROSCOPIC-ADD ON   No results found.   1. Abrasion of scrotum, initial encounter       MDM  Pt appears stable.  No hematuria noted in the ED.  The bleeding is likely related to the scratch on his scrotum.  Will dc home. Follow up with PCP as needed        Celene Kras, MD 06/19/12 2146

## 2012-06-19 NOTE — Telephone Encounter (Signed)
Called the patient to talk with her daughter. Contrary to what the wife was saying, the patient has not been sleeping well at night. The patient could attend to have a delirium state, and the Zyprexa is not helping. The patient will be taken off Zyprexa, and he will be given alprazolam to take if needed for sleep. The patient may need to go to the hospital if the agitation and delirium continues.

## 2012-06-19 NOTE — Telephone Encounter (Signed)
Patient's daughter called stating her father stayed up all last night and the medication(Zyprexa) isn't working.  Patient's daughter would like to speak with physician. Callback number [phone].

## 2012-06-20 ENCOUNTER — Emergency Department (HOSPITAL_COMMUNITY): Payer: Medicare Other

## 2012-06-20 ENCOUNTER — Encounter (HOSPITAL_COMMUNITY): Payer: Self-pay

## 2012-06-20 ENCOUNTER — Emergency Department (HOSPITAL_COMMUNITY)
Admission: EM | Admit: 2012-06-20 | Discharge: 2012-06-21 | Disposition: A | Discharge status: Other Acute Inpatient Hospital | Payer: Medicare Other | Attending: Emergency Medicine | Admitting: Emergency Medicine

## 2012-06-20 ENCOUNTER — Ambulatory Visit (INDEPENDENT_AMBULATORY_CARE_PROVIDER_SITE_OTHER): Payer: Medicare Other | Admitting: Family Medicine

## 2012-06-20 ENCOUNTER — Encounter: Payer: Self-pay | Admitting: Family Medicine

## 2012-06-20 VITALS — BP 110/78 | HR 70

## 2012-06-20 DIAGNOSIS — Z87891 Personal history of nicotine dependence: Secondary | ICD-10-CM | POA: Insufficient documentation

## 2012-06-20 DIAGNOSIS — F03918 Unspecified dementia, unspecified severity, with other behavioral disturbance: Secondary | ICD-10-CM | POA: Insufficient documentation

## 2012-06-20 DIAGNOSIS — Z880 Allergy status to penicillin: Secondary | ICD-10-CM | POA: Insufficient documentation

## 2012-06-20 DIAGNOSIS — I252 Old myocardial infarction: Secondary | ICD-10-CM | POA: Insufficient documentation

## 2012-06-20 DIAGNOSIS — F0391 Unspecified dementia with behavioral disturbance: Secondary | ICD-10-CM

## 2012-06-20 DIAGNOSIS — Z85528 Personal history of other malignant neoplasm of kidney: Secondary | ICD-10-CM | POA: Insufficient documentation

## 2012-06-20 DIAGNOSIS — Z8546 Personal history of malignant neoplasm of prostate: Secondary | ICD-10-CM | POA: Insufficient documentation

## 2012-06-20 DIAGNOSIS — I10 Essential (primary) hypertension: Secondary | ICD-10-CM | POA: Insufficient documentation

## 2012-06-20 DIAGNOSIS — Z8659 Personal history of other mental and behavioral disorders: Secondary | ICD-10-CM | POA: Insufficient documentation

## 2012-06-20 DIAGNOSIS — K439 Ventral hernia without obstruction or gangrene: Secondary | ICD-10-CM | POA: Insufficient documentation

## 2012-06-20 LAB — CBC WITH DIFFERENTIAL/PLATELET
Eosinophils Relative: 6 % — ABNORMAL HIGH (ref 0–5)
HCT: 45.2 % (ref 39.0–52.0)
Lymphocytes Relative: 13 % (ref 12–46)
Lymphs Abs: 1.2 10*3/uL (ref 0.7–4.0)
MCV: 89.2 fL (ref 78.0–100.0)
Monocytes Absolute: 0.9 10*3/uL (ref 0.1–1.0)
RBC: 5.07 MIL/uL (ref 4.22–5.81)
WBC: 9.4 10*3/uL (ref 4.0–10.5)

## 2012-06-20 LAB — COMPREHENSIVE METABOLIC PANEL
ALT: 11 U/L (ref 0–53)
CO2: 23 mEq/L (ref 19–32)
Calcium: 9.5 mg/dL (ref 8.4–10.5)
Chloride: 104 mEq/L (ref 96–112)
GFR calc Af Amer: 44 mL/min — ABNORMAL LOW (ref >90)
GFR calc non Af Amer: 38 mL/min — ABNORMAL LOW (ref >90)
Glucose, Bld: 108 mg/dL — ABNORMAL HIGH (ref 70–99)
Sodium: 139 mEq/L (ref 135–145)
Total Bilirubin: 0.4 mg/dL (ref 0.3–1.2)

## 2012-06-20 MED ORDER — LORAZEPAM 2 MG/ML IJ SOLN
0.5000 mg | Freq: Once | INTRAMUSCULAR | Status: AC
  Administered 2012-06-20: 0.5 mg via INTRAVENOUS
  Filled 2012-06-20: qty 1

## 2012-06-20 NOTE — ED Provider Notes (Signed)
History     CSN: [id number]  Arrival date & time 06/20/12  Mikle Bosworth   First MD Initiated Contact with Patient 06/20/12 2134      Chief Complaint  Patient presents with  . Dementia    (Consider location/radiation/quality/duration/timing/severity/associated sxs/prior treatment) HPI  PLUMER MITTELSTAEDT is a 79 y.o. male with past medical history significant for dementia brought in by his wife and 2 daughters complaining of increasing issues with sleep, and difficulty taking care of him at home. Family states that he wonders in the middle of the night and it's hard to control. They state that he has had an acute decompensation in the last 10-14 days. That before he used to be able to mow the lawn and he is now able to feed himself and take care of his ADLs. Patient was recently put on Zyprexa by his primary care doctor and that was not helping with sleep they have switched him to alprazolam which he attempted to use last night with little success. Denies any fever, nausea vomiting, change in bowel or bladder habits cough above his baseline cough. Family states that they are not open to nursing home placement they're requesting medication review. They state that their uncle was brought in to the hospital of a similar situation and he was fixed within 4 days   Past Medical History  Diagnosis Date  . Dementia   . Hyperlipidemia   . Hallucination 06/07/2012  . Hypertension   . Myocardial infarction   . Cancer     Prostate and Kidney cancer    Past Surgical History  Procedure Laterality Date  . Nephrectomy      Renal cell carcinoma  . Cholecystectomy    . Prostate cancer surgery      Family History  Problem Relation Age of Onset  . Cancer Mother   . Heart attack Father     History  Substance Use Topics  . Smoking status: Former Smoker    Quit date: 05/17/1972  . Smokeless tobacco: Current User    Types: Chew  . Alcohol Use: No      Review of Systems  Unable to perform ROS:  Dementia  Constitutional: Negative for fever.  Gastrointestinal: Negative for nausea, vomiting and diarrhea.  All other systems reviewed and are negative.    Allergies  Contrast media and Penicillins  Home Medications   Current Outpatient Rx  Name  Route  Sig  Dispense  Refill  . ALPRAZolam (XANAX) 1 MG tablet   Oral   Take 1 mg by mouth at bedtime as needed for sleep.         Marland Kitchen aspirin EC 81 MG tablet   Oral   Take 81 mg by mouth at bedtime.         . Coenzyme Q10 300 MG CAPS   Oral   Take 1 capsule by mouth daily.         . fish oil-omega-3 fatty acids 1000 MG capsule   Oral   Take 1 g by mouth daily.          Marland Kitchen latanoprost (XALATAN) 0.005 % ophthalmic solution   Both Eyes   Place 1 drop into both eyes at bedtime.         . Memantine HCl ER (NAMENDA XR) 28 MG CP24   Oral   Take 1 capsule by mouth daily.         . nitroGLYCERIN (NITROSTAT) 0.4 MG SL tablet   Sublingual  Place 0.4 mg under the tongue every 5 (five) minutes as needed for chest pain.         Marland Kitchen OLANZapine (ZYPREXA) 10 MG tablet   Oral   Take 5 mg by mouth 2 (two) times daily.         . pravastatin (PRAVACHOL) 20 MG tablet   Oral   Take 20 mg by mouth at bedtime.         . rivastigmine (EXELON) 4.6 mg/24hr   Transdermal   Place 1 patch onto the skin daily.         . vitamin E 1000 UNIT capsule   Oral   Take 2,000 Units by mouth daily.           BP 117/82  Pulse 76  Temp(Src) 97.8 F (36.6 C) (Oral)  Resp 20  SpO2 96%  Physical Exam  Nursing note and vitals reviewed. Constitutional: He is oriented to person, place, and time. He appears well-developed and well-nourished. No distress.  HENT:  Head: Normocephalic.  Mouth/Throat: Oropharynx is clear and moist.  Dry mucous membranes  Eyes: Conjunctivae and EOM are normal. Pupils are equal, round, and reactive to light.  Neck: Normal range of motion.  Cardiovascular: Normal rate and intact distal pulses.    Pulmonary/Chest: Effort normal and breath sounds normal. No stridor. No respiratory distress. He has no wheezes. He has no rales. He exhibits no tenderness.  Abdominal: Soft. Bowel sounds are normal.  Reducible ventral hernia, no grimacing and deep palpation  Musculoskeletal: Normal range of motion.  Neurological: He is alert and oriented to person, place, and time.  Psychiatric: He has a normal mood and affect.    ED Course  Procedures (including critical care time)  Labs Reviewed  CBC WITH DIFFERENTIAL - Abnormal; Notable for the following:    Eosinophils Relative 6 (*)    All other components within normal limits  COMPREHENSIVE METABOLIC PANEL - Abnormal; Notable for the following:    Glucose, Bld 108 (*)    BUN 25 (*)    Creatinine, Ser 1.63 (*)    GFR calc non Af Amer 38 (*)    GFR calc Af Amer 44 (*)    All other components within normal limits  URINALYSIS, ROUTINE W REFLEX MICROSCOPIC   Ct Head Wo Contrast  06/20/2012   *RADIOLOGY REPORT*  Clinical Data: Severe headaches.  The patient seen last night for same complaint.  Symptoms are worsening.  The patient is confused and agitated.  The  CT HEAD WITHOUT CONTRAST  Technique:  Contiguous axial images were obtained from the base of the skull through the vertex without contrast.  Comparison: 06/15/2012  Findings: Diffuse cerebral atrophy.  Mild ventricular dilatation consistent with central atrophy.  Low attenuation changes in the deep white matter consistent with small vessel ischemia.  The no mass effect or midline shift.  No abnormal extra-axial fluid collections.  Gray-white matter junctions are distinct.  Basal cisterns are not effaced.  No evidence of acute intracranial hemorrhage.  Tortuous and ectatic basilar artery.  No depressed skull fractures.  Visualized paranasal sinuses and mastoid air cells are not opacified.  No significant change since previous study.  IMPRESSION: No acute intracranial abnormalities.  Chronic atrophy  and small vessel ischemic changes are stable.   Original Report Authenticated By: Burman Nieves, M.D.     1. Dementia, with behavioral disturbance       MDM   Filed Vitals:   06/20/12 1947 06/21/12 0009 06/21/12 4098  BP: 117/82 146/81 133/91  Pulse: 76 66 62  Temp: 97.8 F (36.6 C)    TempSrc: Oral    Resp: 20 20 18   SpO2: 96% 96% 97%     RISHAWN WALCK is a 79 y.o. male with history of dementia, becoming more difficult at home. Family declines help with SNF placement they stated they would like to keep him at home they request medication review and titration that would make him more manageable at home. We have had extensive discussions on expectations.  Patient has become agitated requiring several doses of Ativan.   Discussed case with attending who agrees with plan and stability to d/c to home.   Head CT shows no acute abnormality, blood work and urinalysis also within normal. There is no indication for medical admission at this time. Patient is not appropriate for psychiatric ED placement.  Discussion with the act team A. the she will attempt placement in [facility] for dementia stabilization in the a.m.  Pt has a Bed in [place], family will return to ED and social work will coordinate placement.  Case signed out to NP Pickering at shift change  Medications  sodium chloride 0.9 % bolus 500 mL (not administered)  LORazepam (ATIVAN) injection 0.5 mg (0.5 mg Intravenous Given 06/20/12 2248)  LORazepam (ATIVAN) injection 1 mg (1 mg Intravenous Given 06/21/12 0015)  sodium chloride 0.9 % bolus 500 mL (0 mLs Intravenous Stopped 06/21/12 0413)     Wynetta Emery, PA-C 06/21/12 8119

## 2012-06-20 NOTE — ED Notes (Signed)
Pt was seen here last night for the same, pt was sent home and the family states that he is getting worse, he hasn't slept in two days and neither have they, he's confused, agitated and very mobile. Dr Anne Hahn gave him xanax and his sx were worse, his primary Dr told them to come here for further evaluation. Family states they want a consult, temporary  placement, medication adjustment. He currently lives with family and they are unable to maintain him at the home. They aren't ready for nursing home placement but just some stabilization.

## 2012-06-21 ENCOUNTER — Emergency Department (HOSPITAL_COMMUNITY): Payer: Medicare Other

## 2012-06-21 DIAGNOSIS — F332 Major depressive disorder, recurrent severe without psychotic features: Secondary | ICD-10-CM

## 2012-06-21 DIAGNOSIS — F411 Generalized anxiety disorder: Secondary | ICD-10-CM

## 2012-06-21 LAB — URINE CULTURE
Colony Count: NO GROWTH
Culture: NO GROWTH

## 2012-06-21 LAB — URINALYSIS, ROUTINE W REFLEX MICROSCOPIC
Bilirubin Urine: NEGATIVE
Ketones, ur: NEGATIVE mg/dL
Nitrite: NEGATIVE
Protein, ur: NEGATIVE mg/dL
Specific Gravity, Urine: 1.021 (ref 1.005–1.030)
Urobilinogen, UA: 0.2 mg/dL (ref 0.0–1.0)

## 2012-06-21 MED ORDER — SODIUM CHLORIDE 0.9 % IV BOLUS (SEPSIS)
500.0000 mL | Freq: Once | INTRAVENOUS | Status: AC
  Administered 2012-06-21: 500 mL via INTRAVENOUS

## 2012-06-21 MED ORDER — ASPIRIN EC 81 MG PO TBEC
81.0000 mg | DELAYED_RELEASE_TABLET | Freq: Every day | ORAL | Status: DC
  Filled 2012-06-21: qty 1

## 2012-06-21 MED ORDER — MEMANTINE HCL ER 28 MG PO CP24: 1.0000 | ORAL_CAPSULE | Freq: Every day | ORAL | Status: DC

## 2012-06-21 MED ORDER — SIMVASTATIN 20 MG PO TABS
20.0000 mg | ORAL_TABLET | Freq: Every day | ORAL | Status: DC
  Filled 2012-06-21: qty 1

## 2012-06-21 MED ORDER — NICOTINE 21 MG/24HR TD PT24
21.0000 mg | MEDICATED_PATCH | Freq: Every day | TRANSDERMAL | Status: DC
  Administered 2012-06-21: 21 mg via TRANSDERMAL
  Filled 2012-06-21: qty 1

## 2012-06-21 MED ORDER — LORAZEPAM 2 MG/ML IJ SOLN
INTRAMUSCULAR | Status: AC
  Filled 2012-06-21: qty 1

## 2012-06-21 MED ORDER — RISPERIDONE 0.5 MG PO TABS
0.5000 mg | ORAL_TABLET | Freq: Once | ORAL | Status: DC
  Filled 2012-06-21: qty 1

## 2012-06-21 MED ORDER — LATANOPROST 0.005 % OP SOLN
1.0000 [drp] | Freq: Every day | OPHTHALMIC | Status: DC
  Filled 2012-06-21: qty 2.5

## 2012-06-21 MED ORDER — LORAZEPAM 2 MG/ML IJ SOLN
1.0000 mg | Freq: Once | INTRAMUSCULAR | Status: AC
  Administered 2012-06-21: 1 mg via INTRAVENOUS
  Filled 2012-06-21: qty 1

## 2012-06-21 MED ORDER — RISPERIDONE 0.5 MG PO TABS
0.2500 mg | ORAL_TABLET | Freq: Two times a day (BID) | ORAL | Status: DC
  Administered 2012-06-21: 0.25 mg via ORAL
  Filled 2012-06-21: qty 1

## 2012-06-21 MED ORDER — LORAZEPAM 2 MG/ML IJ SOLN
0.5000 mg | Freq: Once | INTRAMUSCULAR | Status: AC
  Administered 2012-06-21: 0.5 mg via INTRAVENOUS

## 2012-06-21 NOTE — ED Notes (Signed)
Pt resting in bed with sitter at bedside 

## 2012-06-21 NOTE — Progress Notes (Signed)
CSW spoke with Delorise Shiner at [facility] who is presenting pt information to MD. CSW awaiting return call.   Catha Gosselin, LCSWA  [phone] .06/21/2012 1436pm

## 2012-06-21 NOTE — ED Notes (Addendum)
Cameron [name] from [facility] intake called with questions re pt's DOB and if pt has a POA.  Spoke to pt's other daughter, does not think that pt has a documented medical POA.  Cameron [name] wants to know if she can have pt's family's consent to IVC pt if needed.  Pt's daughter agreed and gave verbal consent to this nurse to pull IVC papers if needed.  Cameron [name] also stated that she will present pt's chart to their psychiatrist to see if pt is accepted to the facility.

## 2012-06-21 NOTE — ED Notes (Signed)
Pt in bed sleeping. Talked with act team and stated they would assess pt.

## 2012-06-21 NOTE — ED Notes (Signed)
Pt assisted to the BR by [facility] NT sitter and pt's wife.  Labs, EKG and CXR faxed to [facility]

## 2012-06-21 NOTE — ED Notes (Signed)
Daughter is at bedside, went in to speak to her.  She was requesting to speak to the EDP re pt care plan.  She's made aware that at present SW are working on placing pt at [facility].  Daughter still requesting to speak to EDP, Steinl notified.

## 2012-06-21 NOTE — ED Notes (Signed)
Pt care assumed, verbal report obtained.  Engineer, drilling at bedside.  Pt resting comfortably at this time with eyes closed.  Symmetrical rise and fall of chest noted.

## 2012-06-21 NOTE — ED Notes (Signed)
Kristen SW at bedside to speak to family

## 2012-06-21 NOTE — ED Notes (Signed)
Pt's daughter requesting to speak to SW to get the name of the person SW is working with at [facility]  So that she can call someone to expedite the transfer process.  Baxter Hire SW aware

## 2012-06-21 NOTE — Progress Notes (Signed)
[facility] MD Progress Note  06/21/2012 1:18 PM Cameron [name]  MRN:  [id number] Subjective:   Seen today with Dr Lolly Mustache face to face at bedside.  Patient is confused and disoriented but was able to say he live with his wife and called her by her name. He says he has three children but could not give their names including the name of the daughter standing in front of him. Patient is drinking and eating and uses urinal as needed.  He has 1:1 sitter for his safety and he was not violent or agitated at the time of assessment.  He did not know where he is, the month and day.  Daughter and wife also were at the bedside and they are supportive.  Daughter stated that patient's mental status changed in the last two weeks after a medication change.  Patient is referred to [facility] and is waiting for acceptance.  Meanwhile we shall continue to provide care and maintain safety. Diagnosis:   Axis I: Anxiety Disorder NOS and Major Depression, Recurrent severe Axis II: Deferred Axis III:  Past Medical History  Diagnosis Date  . Dementia   . Hyperlipidemia   . Hallucination 06/07/2012  . Hypertension   . Myocardial infarction   . Cancer     Prostate and Kidney cancer   Axis IV: other psychosocial or environmental problems Axis V: 51-60 moderate symptoms  ADL's:  Impaired  Sleep: Fair  Appetite:  Good  Suicidal Ideation:  Plan:   none Intent:  none Means:  none Homicidal Ideation:  Plan:  none Intent:  none Means:  none AEB (as evidenced by):  Psychiatric Specialty Exam: Review of Systems  Unable to perform ROS: mental acuity    Blood pressure 140/86, pulse 89, temperature 97.8 F (36.6 C), temperature source Oral, resp. rate 13, SpO2 97.00%.There is no weight on file to calculate BMI.  General Appearance: Casual  Eye Contact::  Fair  Speech:  Slow, Slurred and Patient had Ativan prior to assessment this am  Volume:  Decreased  Mood:  Anxious and Depressed  Affect:   Congruent, Depressed and Flat  Thought Process:  Disorganized  Orientation:  Other:  Oriented to self, not oriented to place and time.  Thought Content:  Unable to assess at this time, patient is disoriented  Suicidal Thoughts:  No  Homicidal Thoughts:  No  Memory:   Memory changes noted in the past two weeks per family.  Some confusion and disorientation noted.  Judgement:  Other:  Unable to assess at this time  Insight:  unable to assess at this time  Psychomotor Activity:  Increased and Restlessness  Concentration:  unable to assess at this time  Recall:  Unable to assess   Akathisia:  No  Handed:  Right  AIMS (if indicated):     Assets:  Desire for Improvement  Sleep:      Current Medications: No current facility-administered medications for this encounter.   Current Outpatient Prescriptions  Medication Sig Dispense Refill  . ALPRAZolam (XANAX) 1 MG tablet Take 1 mg by mouth at bedtime as needed for sleep.      Marland Kitchen aspirin EC 81 MG tablet Take 81 mg by mouth at bedtime.      . Coenzyme Q10 300 MG CAPS Take 1 capsule by mouth daily.      . fish oil-omega-3 fatty acids 1000 MG capsule Take 1 g by mouth daily.       Marland Kitchen latanoprost (XALATAN) 0.005 %  ophthalmic solution Place 1 drop into both eyes at bedtime.      . Memantine HCl ER (NAMENDA XR) 28 MG CP24 Take 1 capsule by mouth daily.      . nitroGLYCERIN (NITROSTAT) 0.4 MG SL tablet Place 0.4 mg under the tongue every 5 (five) minutes as needed for chest pain.      Marland Kitchen OLANZapine (ZYPREXA) 10 MG tablet Take 5 mg by mouth 2 (two) times daily.      . pravastatin (PRAVACHOL) 20 MG tablet Take 20 mg by mouth at bedtime.      . rivastigmine (EXELON) 4.6 mg/24hr Place 1 patch onto the skin daily.      . vitamin E 1000 UNIT capsule Take 2,000 Units by mouth daily.        Lab Results:  Results for orders placed during the hospital encounter of 06/20/12 (from the past 48 hour(s))  CBC WITH DIFFERENTIAL     Status: Abnormal   Collection  Time    06/20/12 10:53 PM      Result Value Range   WBC 9.4  4.0 - 10.5 K/uL   RBC 5.07  4.22 - 5.81 MIL/uL   Hemoglobin 14.7  13.0 - 17.0 g/dL   HCT 16.1  09.6 - 04.5 %   MCV 89.2  78.0 - 100.0 fL   MCH 29.0  26.0 - 34.0 pg   MCHC 32.5  30.0 - 36.0 g/dL   RDW 40.9  81.1 - 91.4 %   Platelets 195  150 - 400 K/uL   Neutrophils Relative % 71  43 - 77 %   Neutro Abs 6.7  1.7 - 7.7 K/uL   Lymphocytes Relative 13  12 - 46 %   Lymphs Abs 1.2  0.7 - 4.0 K/uL   Monocytes Relative 10  3 - 12 %   Monocytes Absolute 0.9  0.1 - 1.0 K/uL   Eosinophils Relative 6 (*) 0 - 5 %   Eosinophils Absolute 0.5  0.0 - 0.7 K/uL   Basophils Relative 1  0 - 1 %   Basophils Absolute 0.1  0.0 - 0.1 K/uL  COMPREHENSIVE METABOLIC PANEL     Status: Abnormal   Collection Time    06/20/12 10:53 PM      Result Value Range   Sodium 139  135 - 145 mEq/L   Potassium 4.2  3.5 - 5.1 mEq/L   Chloride 104  96 - 112 mEq/L   CO2 23  19 - 32 mEq/L   Glucose, Bld 108 (*) 70 - 99 mg/dL   BUN 25 (*) 6 - 23 mg/dL   Creatinine, Ser 7.82 (*) 0.50 - 1.35 mg/dL   Calcium 9.5  8.4 - 95.6 mg/dL   Total Protein 6.6  6.0 - 8.3 g/dL   Albumin 3.6  3.5 - 5.2 g/dL   AST 16  0 - 37 U/L   ALT 11  0 - 53 U/L   Alkaline Phosphatase 64  39 - 117 U/L   Total Bilirubin 0.4  0.3 - 1.2 mg/dL   GFR calc non Af Amer 38 (*) >90 mL/min   GFR calc Af Amer 44 (*) >90 mL/min   Comment:            The eGFR has been calculated     using the CKD EPI equation.     This calculation has not been     validated in all clinical     situations.     eGFR's  persistently     <90 mL/min signify     possible Chronic Kidney Disease.  URINALYSIS, ROUTINE W REFLEX MICROSCOPIC     Status: None   Collection Time    06/21/12 12:47 AM      Result Value Range   Color, Urine YELLOW  YELLOW   APPearance CLEAR  CLEAR   Specific Gravity, Urine 1.021  1.005 - 1.030   pH 5.0  5.0 - 8.0   Glucose, UA NEGATIVE  NEGATIVE mg/dL   Hgb urine dipstick NEGATIVE   NEGATIVE   Bilirubin Urine NEGATIVE  NEGATIVE   Ketones, ur NEGATIVE  NEGATIVE mg/dL   Protein, ur NEGATIVE  NEGATIVE mg/dL   Urobilinogen, UA 0.2  0.0 - 1.0 mg/dL   Nitrite NEGATIVE  NEGATIVE   Leukocytes, UA NEGATIVE  NEGATIVE   Comment: MICROSCOPIC NOT DONE ON URINES WITH NEGATIVE PROTEIN, BLOOD, LEUKOCYTES, NITRITE, OR GLUCOSE <1000 mg/dL.    Physical Findings: AIMS:  , ,  ,  ,    CIWA:    COWS:     Treatment Plan Summary: Daily contact with patient to assess and evaluate symptoms and progress in treatment Medication management  Plan:  Consult with Dr Lolly Mustache and face to face interview. Recommendation/Plan:  Continue to seek transfer to [facility].  Provide safety and stabilization.  Medical Decision Making Problem Points:  Established problem, stable/improving (1) Data Points:  Review and summation of old records (2)  I certify that inpatient services furnished can reasonably be expected to improve the patient's condition.   Dahlia Byes, C  PMHNP-BC 06/21/2012, 1:18 PM I personally seen the patient agreed with the findings and involved in the treatment plan.

## 2012-06-21 NOTE — ED Notes (Signed)
MD at bedside. Steinl EDP 

## 2012-06-21 NOTE — ED Notes (Signed)
Pt has been agitated.  Pt medicated as ordered.  Family remains at bedside.

## 2012-06-21 NOTE — Progress Notes (Addendum)
Pt accepted to [facility] pending IVC paperwork. CSW spoke with Magistrates office who stated fax machine was down and directed me to call sherrif's department and fax there. CSW faxed IVC paperwork to [phone] to Deputy [place]. Magistrate office informed that paperwork was faxed to that number and will obtain ivc paperwork from there to serve patient. Once patient is served, nurse will arrange non emergency ems and sherrif to transport patient to [place].   CSW awaiting to receive accepting doctor name and room assign number.   Catha Gosselin, LCSWA  [phone] .06/21/2012 1640pm   Addendum Pt accepted by Dr. Lowanda Foster number to call report is [phone].   Marland KitchenCatha Gosselin, Theresia Majors  [phone] .06/21/2012 1703pm

## 2012-06-21 NOTE — ED Notes (Addendum)
Pt is becoming more agitated, attempting to get OOB.  Family is requesting to have something given to pt to calm pt down, [facility] EDP notified.

## 2012-06-21 NOTE — BH Assessment (Signed)
Assessment Note Patient is a 79 year old white male  Patient was not orientated X4 during the assessment.   Patient was grabbing at the air.  Patient was not responsive to questions.  Patient was not able to verbalize. Patient was very disorientated and confused during the session.  Patient was not able to make eye contact and answer questions.  A follow up assessment is recommended for this patient in the morning.    Documentation in the file reports the following: Patient has not slept in two days.  Patient is agitated and very mobile. Patient is on a new medication for hallucinations.  Patient is having "accidents" on himself, not sleeping at all and getting up and down all night long.  Family reports that his ability to able to carry out daily living activities has declined.   Axis I: Dementia  Axis II: Deferred Axis III:  Past Medical History  Diagnosis Date  . Dementia   . Hyperlipidemia   . Hallucination 06/07/2012  . Hypertension   . Myocardial infarction   . Cancer     Prostate and Kidney cancer   Axis IV: other psychosocial or environmental problems, problems related to social environment and problems with access to health care services Axis V: 31-40 impairment in reality testing  Past Medical History:  Past Medical History  Diagnosis Date  . Dementia   . Hyperlipidemia   . Hallucination 06/07/2012  . Hypertension   . Myocardial infarction   . Cancer     Prostate and Kidney cancer    Past Surgical History  Procedure Laterality Date  . Nephrectomy      Renal cell carcinoma  . Cholecystectomy    . Prostate cancer surgery      Family History:  Family History  Problem Relation Age of Onset  . Cancer Mother   . Heart attack Father     Social History:  reports that he quit smoking about 40 years ago. His smokeless tobacco use includes Chew. He reports that he does not drink alcohol or use illicit drugs.  Additional Social History:     CIWA: CIWA-Ar BP: 133/91  mmHg Pulse Rate: 62 COWS:    Allergies:  Allergies  Allergen Reactions  . Contrast Media (Iodinated Diagnostic Agents)   . Penicillins     Home Medications:  (Not in a hospital admission)  OB/GYN Status:  No LMP for male patient.  General Assessment Data Location of Assessment: [facility] ED ACT Assessment: Yes Living Arrangements: Spouse/significant other Can pt return to current living arrangement?: Yes Admission Status: Voluntary Is patient capable of signing voluntary admission?: Yes Transfer from: Acute Hospital Referral Source: Self/Family/Friend  Education Status Is patient currently in school?: No  Risk to self Suicidal Ideation: No Suicidal Intent: No Is patient at risk for suicide?: No Suicidal Plan?: No Access to Means: No What has been your use of drugs/alcohol within the last 12 months?: N/A Previous Attempts/Gestures: No How many times?: 0 Other Self Harm Risks: No Triggers for Past Attempts: None known Intentional Self Injurious Behavior: None Family Suicide History: No Persecutory voices/beliefs?: No Depression: Yes Depression Symptoms: Despondent;Insomnia;Fatigue;Loss of interest in usual pleasures Substance abuse history and/or treatment for substance abuse?: No Suicide prevention information given to non-admitted patients: Not applicable  Risk to Others Homicidal Ideation: No Thoughts of Harm to Others: No Current Homicidal Intent: No Current Homicidal Plan: No Access to Homicidal Means: No Identified Victim: none  History of harm to others?: No Assessment of Violence: None  Noted Violent Behavior Description: calm Does patient have access to weapons?: No Criminal Charges Pending?: No Does patient have a court date: No  Psychosis Hallucinations: None noted Delusions: None noted  Mental Status Report Appear/Hygiene: Disheveled Eye Contact: Poor Motor Activity: Freedom of movement Speech: Word salad Level of Consciousness: Unable to  assess     ADLScreening [facility]) Patient's cognitive ability adequate to safely complete daily activities?: No Patient able to express need for assistance with ADLs?: No Independently performs ADLs?: No  Abuse/Neglect [facility]) Physical Abuse: Denies Verbal Abuse: Denies Sexual Abuse: Denies  Prior Inpatient Therapy Prior Inpatient Therapy: No Prior Therapy Dates: na Prior Therapy Facilty/Provider(s): na Reason for Treatment: na  Prior Outpatient Therapy Prior Outpatient Therapy: No Prior Therapy Dates: na Prior Therapy Facilty/Provider(s): na Reason for Treatment: na  ADL Screening (condition at time of admission) Patient's cognitive ability adequate to safely complete daily activities?: No Patient able to express need for assistance with ADLs?: No Independently performs ADLs?: No Communication: Needs assistance Dressing (OT): Needs assistance Grooming: Needs assistance Feeding: Needs assistance Bathing: Needs assistance Toileting: Needs assistance In/Out Bed: Needs assistance Walks in Home: Needs assistance       Abuse/Neglect Assessment (Assessment to be complete while patient is alone) Physical Abuse: Denies Verbal Abuse: Denies Sexual Abuse: Denies Values / Beliefs Cultural Requests During Hospitalization: None Spiritual Requests During Hospitalization: None        Additional Information 1:1 In Past 12 Months?: Yes CIRT Risk: No Elopement Risk: No Does patient have medical clearance?: Yes     Disposition: Pending [place].   Disposition Initial Assessment Completed for this Encounter: Yes Disposition of Patient: Other dispositions Other disposition(s): Other (Comment) Patient referred to: Other (Comment)  On Site Evaluation by:   Reviewed with Physician:     Phillip Heal LaVerne 06/21/2012 9:16 AM

## 2012-06-21 NOTE — ED Provider Notes (Addendum)
Alert, content. Periods mildly agitated behavior. Psych team/Dr Lolly Mustache has seen, and recommends risperdal, initial dose given, and they recommend starting on .25 at regular interval bid. Checked a act team, still pending for possible [place], disposition remains pending, family updated.      Suzi Roots, MD 06/21/12 [phone]

## 2012-06-21 NOTE — ED Provider Notes (Signed)
Accepted at [facility], Dr Yetta Barre.  Juliet Rude. Rubin Payor, MD 06/21/12 1747

## 2012-06-24 DIAGNOSIS — F0391 Unspecified dementia with behavioral disturbance: Secondary | ICD-10-CM | POA: Insufficient documentation

## 2012-06-24 NOTE — Progress Notes (Signed)
  Subjective:    Patient ID: Cameron [name], male    DOB: 09/22/1928, 79 y.o.   MRN: [id number]  HPI Patient arrives office with 5 of his adult children and spouse for a very extensive discussion Patient has had a long-term history of dementia. This was followed by the neurologist. Dr. love may the initial diagnosis. Now followed by Dr. Anne Hahn. Patient has had this diagnosis for years.  Unfortunately his dementia has progressed considerably in recent months. I personally reviewed all of the emergency room notes and Dr. Anne Hahn note and telephone conversations. I also reviewed his recent lab work along with scan results.  Of note patient had been worked up in the emergency room in recent weeks for all of the typical conditions that may cause a delirium type pattern.  Patient has been placed on Zyprexa. This was not helpful. It caused sedation. Worsening side effects. Therefore the neurologist recently advised increasing Xanax to 1 mg each bedtime. Family does gave the patient 0.5 mg last night.  Review of Systems Review systems unable to obtain because of patient's advanced dementia    Objective:   Physical Exam Patient is a pleasant no acute distress, clearly disoriented. Does not say any words that make any particular sense except for in the moment. No focal neurological deficits. Lungs clear. Heart regular rate and rhythm. Ankles without edema. Quietly he listens to family as they discussed their concerns.       Assessment & Plan:  Impression and plan #1 advancing dementia. Followed closely by the neurologists in the past. The family presented today because they were under the impression from Dr. Clarisa Kindred nurse that I would be able to admit the patient directly to behavioral health. I have advised them that I cannot do that. We had an extremely long discussion about the nature of dementia. Several daughters were working under the assumption that if we just give the right medicine for his  "chemical imbalance" that he would return to his prior state. I am not optimistic at all about that. I did discuss my rationale with him. We also talked about the nature of dementia. We talked about the need for future concerns. And the need for future plans. Options were discussed. A full 1 hour and 10 minutes were spent between discussion with the family and review of all the prior notes, including 3 recent emergency room visits consultations reports et Karie Soda. Patient advised to followup with Dr. Anne Hahn for medication management. Also advised if he worsens to consider strongly going to the emergency room at [facility] for admission where he would have access to both neurologists and in psychiatrist if necessary. WSL

## 2012-07-01 NOTE — ED Provider Notes (Signed)
Medical screening examination/treatment/procedure(s) were conducted as a shared visit with non-physician practitioner(s) and myself.  I personally evaluated the patient during the encounter   Rolan Bucco, MD 07/01/12 1726

## 2012-07-02 ENCOUNTER — Telehealth: Payer: Self-pay | Admitting: Neurology

## 2012-07-03 NOTE — Telephone Encounter (Signed)
Called patient daughter left voicemail to return my call regarding her father.

## 2012-08-08 ENCOUNTER — Ambulatory Visit: Payer: Self-pay | Admitting: Neurology

## 2012-08-15 ENCOUNTER — Telehealth: Payer: Self-pay | Admitting: Family Medicine

## 2012-08-15 NOTE — Telephone Encounter (Signed)
FYI - Patient's wife wanted you to know that he is now in a nursing home.

## 2012-08-19 ENCOUNTER — Ambulatory Visit: Payer: Medicare Other | Admitting: Family Medicine

## 2012-12-18 ENCOUNTER — Telehealth: Payer: Self-pay | Admitting: Neurology

## 2012-12-18 ENCOUNTER — Ambulatory Visit (INDEPENDENT_AMBULATORY_CARE_PROVIDER_SITE_OTHER): Payer: Self-pay | Admitting: Neurology

## 2012-12-18 DIAGNOSIS — R443 Hallucinations, unspecified: Secondary | ICD-10-CM

## 2012-12-18 NOTE — Telephone Encounter (Signed)
The patient did not show for the revisit appointment today. 

## 2012-12-19 NOTE — Progress Notes (Signed)
Filed in error

## 2013-01-09 DEATH — deceased

## 2013-01-27 ENCOUNTER — Telehealth: Payer: Self-pay

## 2013-01-27 NOTE — Telephone Encounter (Signed)
Patient past away @ [facility] per Cameron [name] in [place]

## 2015-01-02 IMAGING — CR DG CHEST 2V
2 series · 2 of 2 positions shown · non-contrast
Comparison: 12/29/2008

CLINICAL DATA: Weakness, fatigue.

CHEST - 2 VIEW

[view not recorded (1 of 2)]
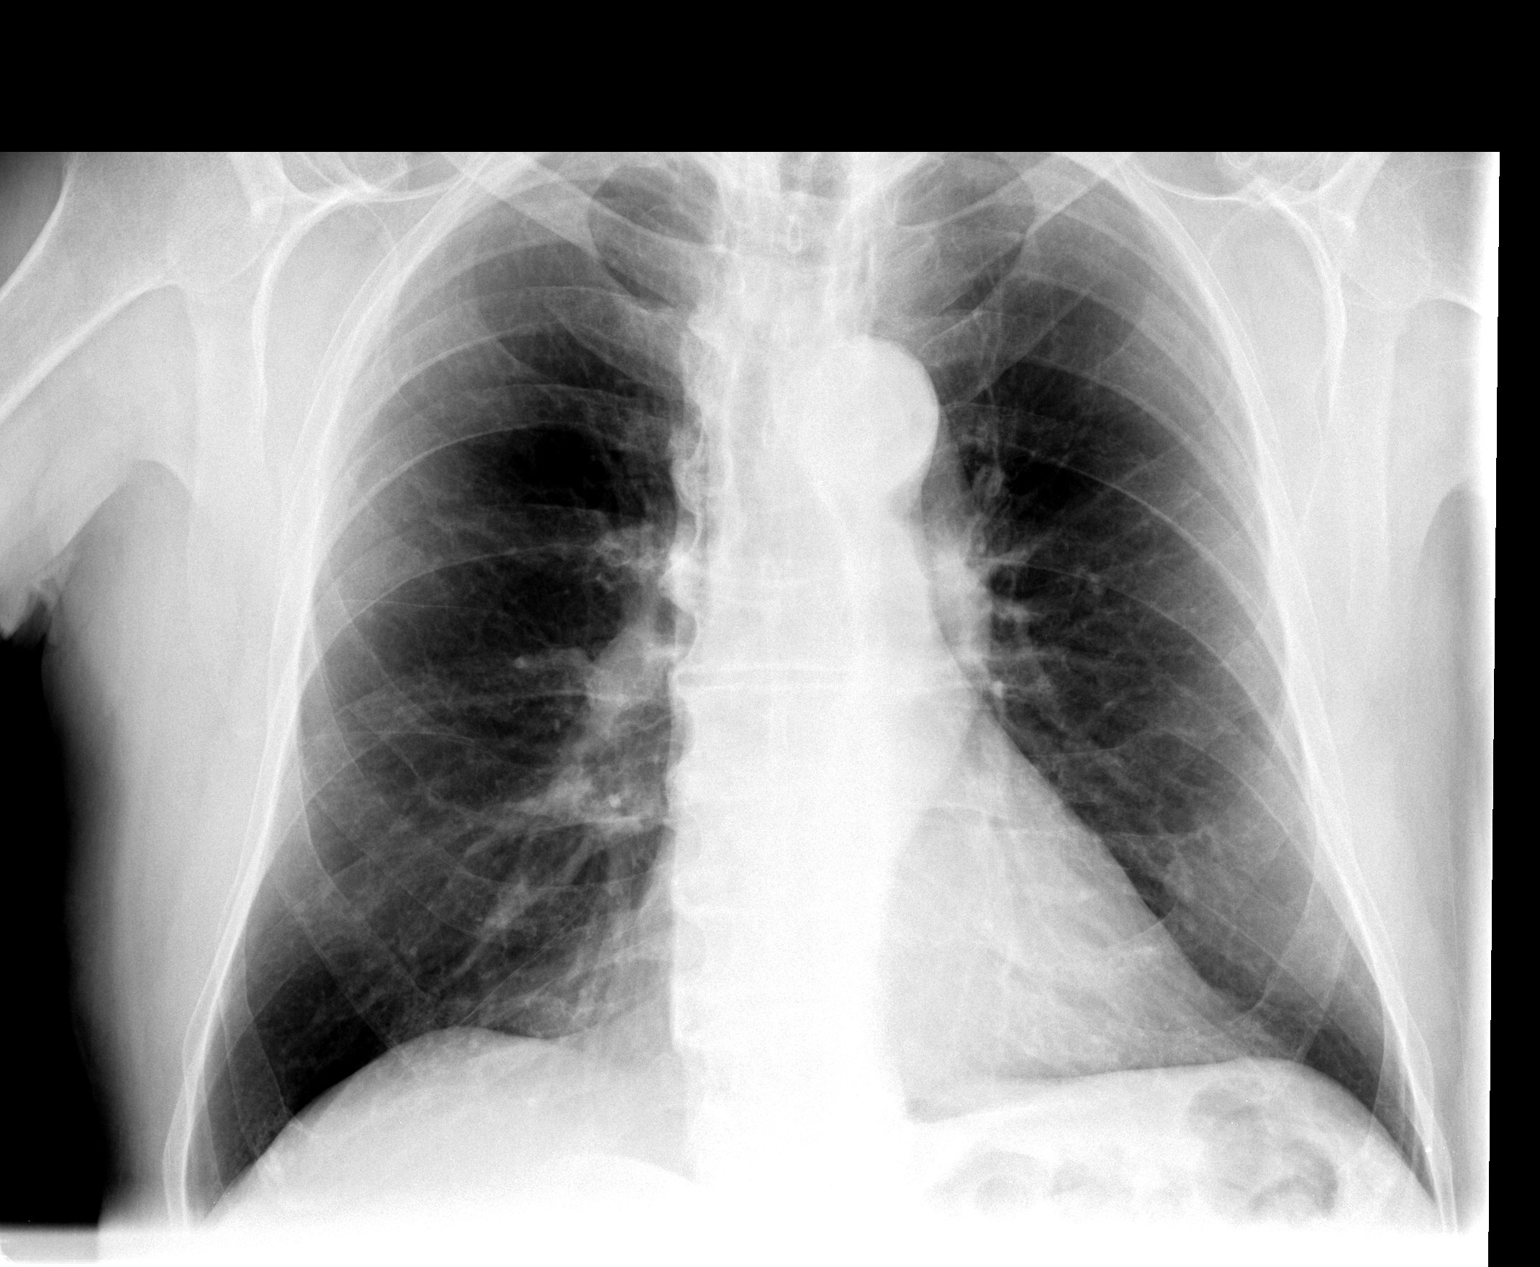

[view not recorded (2 of 2)]
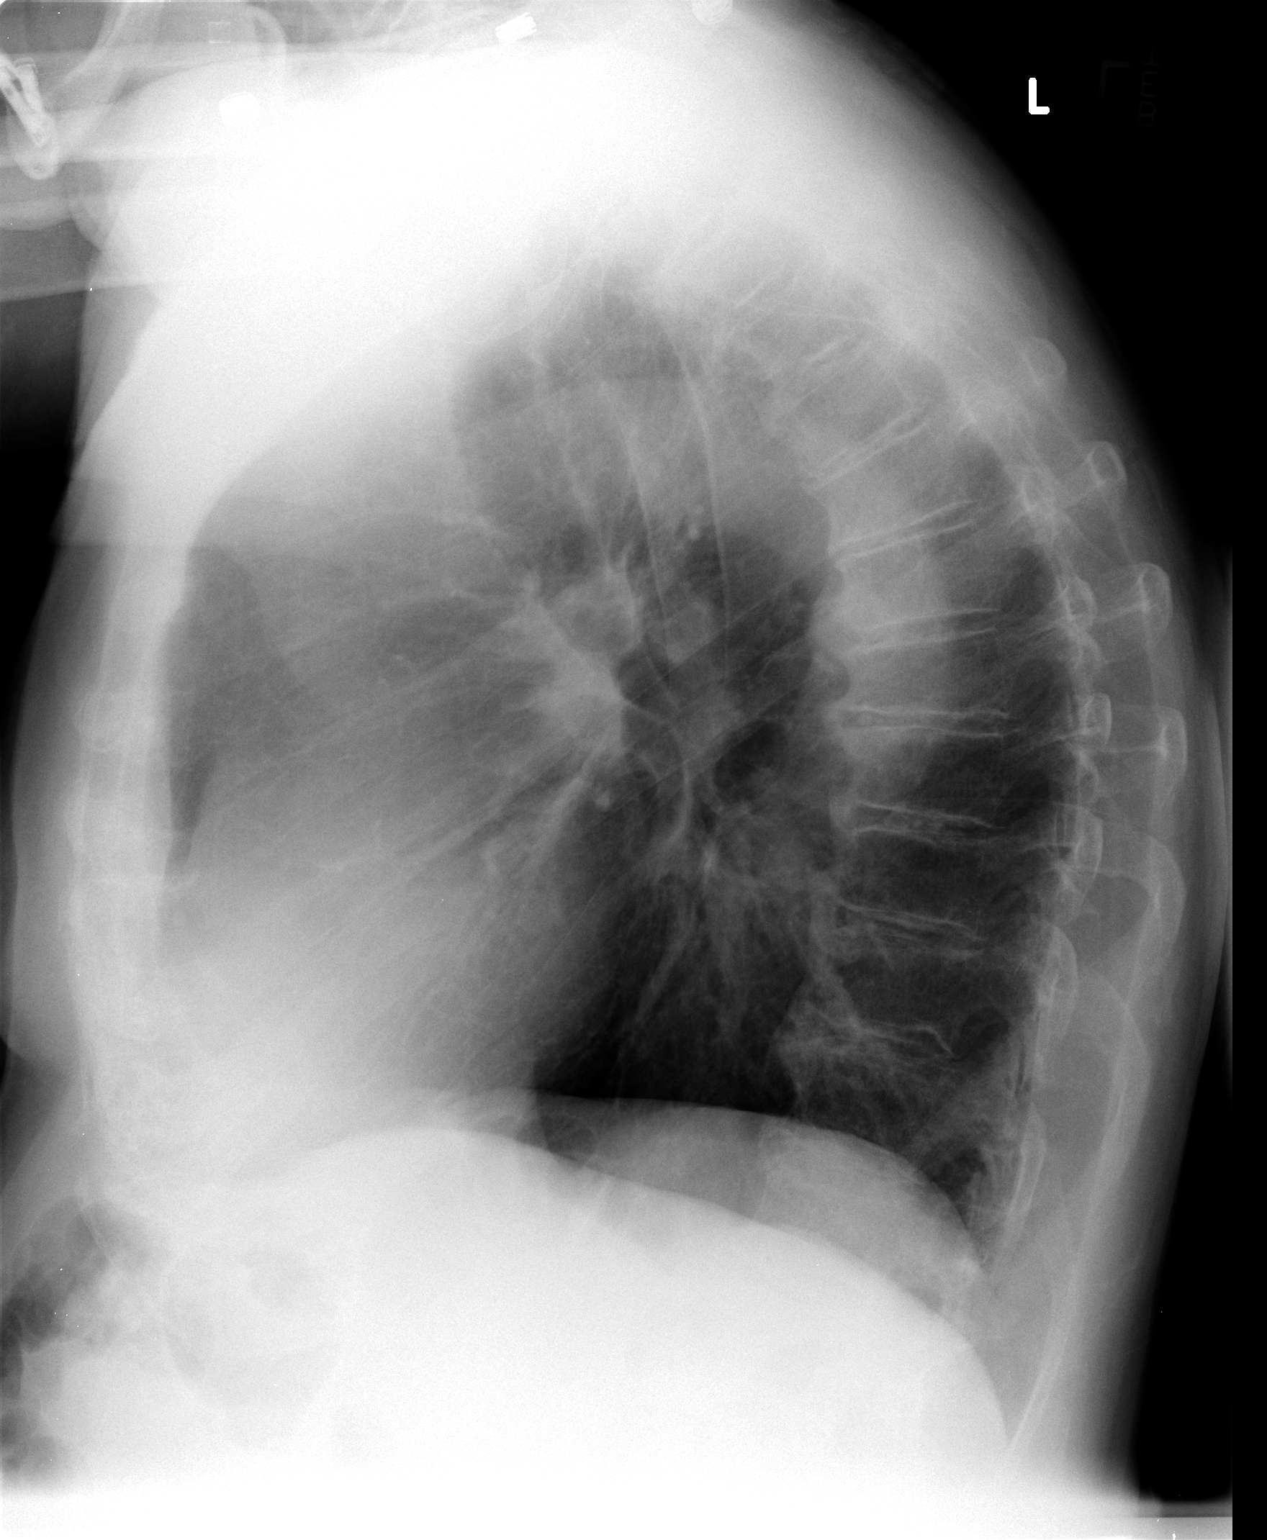

[2 of 2 positions shown; findings below may reference images not displayed]

FINDINGS: Tortuous thoracic aorta.  Degenerative spurring in the
mid thoracic spine.  Lungs are hyperinflated with attenuated
bronchovascular markings.  No focal infiltrate.  No effusion.
Heart size normal.
IMPRESSION: Hyperinflation without acute or superimposed abnormality.
